# Patient Record
Sex: Female | Born: 1945 | Race: Black or African American | Hispanic: No | Smoking: Never smoker
Health system: Southern US, Community
[De-identification: ages and names within clinical notes are randomized; demographics above are authoritative.]

## PROBLEM LIST (undated history)

## (undated) DIAGNOSIS — R51 Headache: Secondary | ICD-10-CM

## (undated) DIAGNOSIS — C50919 Malignant neoplasm of unspecified site of unspecified female breast: Secondary | ICD-10-CM

## (undated) DIAGNOSIS — E785 Hyperlipidemia, unspecified: Secondary | ICD-10-CM

## (undated) DIAGNOSIS — E876 Hypokalemia: Secondary | ICD-10-CM

## (undated) DIAGNOSIS — G629 Polyneuropathy, unspecified: Secondary | ICD-10-CM

## (undated) DIAGNOSIS — M810 Age-related osteoporosis without current pathological fracture: Secondary | ICD-10-CM

## (undated) DIAGNOSIS — R7309 Other abnormal glucose: Secondary | ICD-10-CM

## (undated) DIAGNOSIS — I10 Essential (primary) hypertension: Secondary | ICD-10-CM

## (undated) DIAGNOSIS — N3281 Overactive bladder: Secondary | ICD-10-CM

## (undated) DIAGNOSIS — D869 Sarcoidosis, unspecified: Secondary | ICD-10-CM

## (undated) DIAGNOSIS — Z8679 Personal history of other diseases of the circulatory system: Secondary | ICD-10-CM

## (undated) DIAGNOSIS — G4700 Insomnia, unspecified: Secondary | ICD-10-CM

## (undated) DIAGNOSIS — M8580 Other specified disorders of bone density and structure, unspecified site: Secondary | ICD-10-CM

## (undated) HISTORY — PX: TUBAL LIGATION: SHX77

## (undated) HISTORY — DX: Sarcoidosis, unspecified: D86.9

## (undated) HISTORY — DX: Other specified disorders of bone density and structure, unspecified site: M85.80

## (undated) HISTORY — DX: Other abnormal glucose: R73.09

## (undated) HISTORY — DX: Headache: R51

## (undated) HISTORY — DX: Age-related osteoporosis without current pathological fracture: M81.0

## (undated) HISTORY — DX: Insomnia, unspecified: G47.00

## (undated) HISTORY — DX: Essential (primary) hypertension: I10

## (undated) HISTORY — DX: Polyneuropathy, unspecified: G62.9

## (undated) HISTORY — DX: Personal history of other diseases of the circulatory system: Z86.79

## (undated) HISTORY — PX: WRIST FRACTURE SURGERY: SHX121

## (undated) HISTORY — DX: Malignant neoplasm of unspecified site of unspecified female breast: C50.919

## (undated) HISTORY — PX: OTHER SURGICAL HISTORY: SHX169

## (undated) HISTORY — PX: BREAST LUMPECTOMY: SHX2

## (undated) HISTORY — DX: Hyperlipidemia, unspecified: E78.5

## (undated) HISTORY — DX: Overactive bladder: N32.81

## (undated) HISTORY — DX: Hypokalemia: E87.6

---

## 1997-12-01 ENCOUNTER — Other Ambulatory Visit: Admission: RE | Admit: 1997-12-01 | Discharge: 1997-12-01 | Payer: Self-pay | Admitting: Gastroenterology

## 1998-08-25 ENCOUNTER — Ambulatory Visit (HOSPITAL_COMMUNITY): Admission: RE | Admit: 1998-08-25 | Discharge: 1998-08-25 | Payer: Self-pay | Admitting: *Deleted

## 1998-12-09 ENCOUNTER — Other Ambulatory Visit: Admission: RE | Admit: 1998-12-09 | Discharge: 1998-12-09 | Payer: Self-pay | Admitting: Radiology

## 1999-01-08 ENCOUNTER — Ambulatory Visit (HOSPITAL_COMMUNITY): Admission: RE | Admit: 1999-01-08 | Discharge: 1999-01-08 | Payer: Self-pay | Admitting: Internal Medicine

## 1999-01-08 ENCOUNTER — Encounter: Payer: Self-pay | Admitting: Internal Medicine

## 1999-07-27 ENCOUNTER — Encounter (INDEPENDENT_AMBULATORY_CARE_PROVIDER_SITE_OTHER): Payer: Self-pay | Admitting: Specialist

## 1999-07-27 ENCOUNTER — Ambulatory Visit (HOSPITAL_BASED_OUTPATIENT_CLINIC_OR_DEPARTMENT_OTHER): Admission: RE | Admit: 1999-07-27 | Discharge: 1999-07-27 | Payer: Self-pay

## 1999-12-28 ENCOUNTER — Encounter: Admission: RE | Admit: 1999-12-28 | Discharge: 2000-03-27 | Payer: Self-pay | Admitting: *Deleted

## 2001-02-09 ENCOUNTER — Other Ambulatory Visit: Admission: RE | Admit: 2001-02-09 | Discharge: 2001-02-09 | Payer: Self-pay | Admitting: Obstetrics and Gynecology

## 2001-08-22 ENCOUNTER — Ambulatory Visit (HOSPITAL_COMMUNITY): Admission: RE | Admit: 2001-08-22 | Discharge: 2001-08-22 | Payer: Self-pay | Admitting: Oncology

## 2002-02-27 ENCOUNTER — Other Ambulatory Visit: Admission: RE | Admit: 2002-02-27 | Discharge: 2002-02-27 | Payer: Self-pay | Admitting: Obstetrics and Gynecology

## 2002-12-24 ENCOUNTER — Ambulatory Visit (HOSPITAL_COMMUNITY): Admission: RE | Admit: 2002-12-24 | Discharge: 2002-12-24 | Payer: Self-pay | Admitting: Oncology

## 2002-12-24 ENCOUNTER — Encounter: Payer: Self-pay | Admitting: Oncology

## 2003-01-08 ENCOUNTER — Encounter: Admission: RE | Admit: 2003-01-08 | Discharge: 2003-04-08 | Payer: Self-pay | Admitting: Oncology

## 2003-03-10 ENCOUNTER — Other Ambulatory Visit: Admission: RE | Admit: 2003-03-10 | Discharge: 2003-03-10 | Payer: Self-pay | Admitting: Obstetrics and Gynecology

## 2003-09-12 ENCOUNTER — Ambulatory Visit (HOSPITAL_COMMUNITY): Admission: RE | Admit: 2003-09-12 | Discharge: 2003-09-12 | Payer: Self-pay | Admitting: Internal Medicine

## 2004-09-16 ENCOUNTER — Ambulatory Visit: Payer: Self-pay | Admitting: Oncology

## 2004-11-26 ENCOUNTER — Ambulatory Visit: Payer: Self-pay | Admitting: Internal Medicine

## 2004-12-02 ENCOUNTER — Ambulatory Visit: Payer: Self-pay | Admitting: Internal Medicine

## 2005-05-12 ENCOUNTER — Ambulatory Visit: Payer: Self-pay | Admitting: Internal Medicine

## 2005-09-09 ENCOUNTER — Ambulatory Visit: Payer: Self-pay | Admitting: Oncology

## 2005-09-12 LAB — CBC WITH DIFFERENTIAL/PLATELET
Basophils Absolute: 0 10*3/uL (ref 0.0–0.1)
HCT: 37.3 % (ref 34.8–46.6)
HGB: 12.2 g/dL (ref 11.6–15.9)
LYMPH%: 33 % (ref 14.0–48.0)
MCH: 25.1 pg — ABNORMAL LOW (ref 26.0–34.0)
MONO#: 0.4 10*3/uL (ref 0.1–0.9)
NEUT%: 58.8 % (ref 39.6–76.8)
Platelets: 266 10*3/uL (ref 145–400)
WBC: 5.4 10*3/uL (ref 3.9–10.0)
lymph#: 1.8 10*3/uL (ref 0.9–3.3)

## 2005-09-12 LAB — COMPREHENSIVE METABOLIC PANEL
Albumin: 4.4 g/dL (ref 3.5–5.2)
BUN: 14 mg/dL (ref 6–23)
Calcium: 9.9 mg/dL (ref 8.4–10.5)
Chloride: 104 mEq/L (ref 96–112)
Creatinine, Ser: 0.9 mg/dL (ref 0.4–1.2)
Glucose, Bld: 86 mg/dL (ref 70–99)
Potassium: 4 mEq/L (ref 3.5–5.3)

## 2005-11-17 ENCOUNTER — Ambulatory Visit: Payer: Self-pay | Admitting: Internal Medicine

## 2006-01-06 ENCOUNTER — Ambulatory Visit: Payer: Self-pay | Admitting: Gastroenterology

## 2006-01-09 ENCOUNTER — Ambulatory Visit: Payer: Self-pay | Admitting: Internal Medicine

## 2006-01-10 ENCOUNTER — Ambulatory Visit: Payer: Self-pay | Admitting: Internal Medicine

## 2006-01-18 ENCOUNTER — Ambulatory Visit (HOSPITAL_COMMUNITY): Admission: RE | Admit: 2006-01-18 | Discharge: 2006-01-18 | Payer: Self-pay | Admitting: Ophthalmology

## 2006-01-19 ENCOUNTER — Encounter (INDEPENDENT_AMBULATORY_CARE_PROVIDER_SITE_OTHER): Payer: Self-pay | Admitting: Specialist

## 2006-01-19 ENCOUNTER — Ambulatory Visit: Payer: Self-pay | Admitting: Gastroenterology

## 2006-01-19 HISTORY — PX: POLYPECTOMY: SHX149

## 2006-01-19 HISTORY — PX: COLONOSCOPY: SHX174

## 2006-01-31 ENCOUNTER — Ambulatory Visit: Payer: Self-pay | Admitting: Internal Medicine

## 2006-02-07 ENCOUNTER — Ambulatory Visit: Payer: Self-pay | Admitting: Internal Medicine

## 2006-03-13 ENCOUNTER — Ambulatory Visit: Payer: Self-pay | Admitting: Internal Medicine

## 2006-08-31 ENCOUNTER — Ambulatory Visit: Payer: Self-pay | Admitting: Oncology

## 2006-09-05 LAB — CBC WITH DIFFERENTIAL/PLATELET
Basophils Absolute: 0 10*3/uL (ref 0.0–0.1)
EOS%: 1.7 % (ref 0.0–7.0)
Eosinophils Absolute: 0.1 10*3/uL (ref 0.0–0.5)
HCT: 37.1 % (ref 34.8–46.6)
HGB: 12.3 g/dL (ref 11.6–15.9)
MONO#: 0.5 10*3/uL (ref 0.1–0.9)
NEUT#: 1.9 10*3/uL (ref 1.5–6.5)
NEUT%: 46 % (ref 39.6–76.8)
RDW: 14.2 % (ref 11.3–14.5)
WBC: 4.1 10*3/uL (ref 3.9–10.0)
lymph#: 1.6 10*3/uL (ref 0.9–3.3)

## 2006-09-05 LAB — COMPREHENSIVE METABOLIC PANEL
AST: 25 U/L (ref 0–37)
Albumin: 4.1 g/dL (ref 3.5–5.2)
BUN: 16 mg/dL (ref 6–23)
CO2: 24 mEq/L (ref 19–32)
Calcium: 9.3 mg/dL (ref 8.4–10.5)
Chloride: 105 mEq/L (ref 96–112)
Glucose, Bld: 82 mg/dL (ref 70–99)
Potassium: 3.8 mEq/L (ref 3.5–5.3)

## 2006-12-09 DIAGNOSIS — Z853 Personal history of malignant neoplasm of breast: Secondary | ICD-10-CM | POA: Insufficient documentation

## 2006-12-09 DIAGNOSIS — Z860100 Personal history of colon polyps, unspecified: Secondary | ICD-10-CM | POA: Insufficient documentation

## 2006-12-22 ENCOUNTER — Encounter: Payer: Self-pay | Admitting: Internal Medicine

## 2006-12-22 LAB — CONVERTED CEMR LAB
Pap Smear: NORMAL
Pap Smear: NORMAL

## 2006-12-28 ENCOUNTER — Ambulatory Visit: Payer: Self-pay | Admitting: Internal Medicine

## 2006-12-28 LAB — CONVERTED CEMR LAB
ALT: 28 units/L (ref 0–35)
AST: 39 units/L — ABNORMAL HIGH (ref 0–37)
Albumin: 3.7 g/dL (ref 3.5–5.2)
Alkaline Phosphatase: 74 units/L (ref 39–117)
BUN: 16 mg/dL (ref 6–23)
Basophils Absolute: 0 10*3/uL (ref 0.0–0.1)
Basophils Relative: 0.7 % (ref 0.0–1.0)
CO2: 27 meq/L (ref 19–32)
Calcium: 9.6 mg/dL (ref 8.4–10.5)
Chloride: 104 meq/L (ref 96–112)
Creatinine, Ser: 0.7 mg/dL (ref 0.4–1.2)
Eosinophils Absolute: 0.1 10*3/uL (ref 0.0–0.6)
Eosinophils Relative: 1.8 % (ref 0.0–5.0)
GFR calc Af Amer: 109 mL/min
GFR calc non Af Amer: 90 mL/min
Glucose, Bld: 103 mg/dL — ABNORMAL HIGH (ref 70–99)
HCT: 36.9 % (ref 36.0–46.0)
Hemoglobin: 12.1 g/dL (ref 12.0–15.0)
Lymphocytes Relative: 43.8 % (ref 12.0–46.0)
MCHC: 32.9 g/dL (ref 30.0–36.0)
MCV: 76.4 fL — ABNORMAL LOW (ref 78.0–100.0)
Monocytes Absolute: 0.4 10*3/uL (ref 0.2–0.7)
Monocytes Relative: 7.9 % (ref 3.0–11.0)
Neutro Abs: 2.4 10*3/uL (ref 1.4–7.7)
Neutrophils Relative %: 45.8 % (ref 43.0–77.0)
Platelets: 262 10*3/uL (ref 150–400)
Potassium: 4 meq/L (ref 3.5–5.1)
RBC: 4.82 M/uL (ref 3.87–5.11)
RDW: 13.9 % (ref 11.5–14.6)
Sodium: 139 meq/L (ref 135–145)
Total Bilirubin: 0.7 mg/dL (ref 0.3–1.2)
Total Protein: 7.2 g/dL (ref 6.0–8.3)
WBC: 5.1 10*3/uL (ref 4.5–10.5)

## 2007-01-19 ENCOUNTER — Ambulatory Visit: Payer: Self-pay | Admitting: Internal Medicine

## 2007-01-19 LAB — CONVERTED CEMR LAB
ALT: 22 units/L (ref 0–35)
AST: 29 units/L (ref 0–37)
Albumin: 3.8 g/dL (ref 3.5–5.2)
Alkaline Phosphatase: 69 units/L (ref 39–117)
BUN: 13 mg/dL (ref 6–23)
Basophils Absolute: 0 10*3/uL (ref 0.0–0.1)
Basophils Relative: 0.5 % (ref 0.0–1.0)
Bilirubin Urine: NEGATIVE
Bilirubin, Direct: 0.1 mg/dL (ref 0.0–0.3)
CO2: 28 meq/L (ref 19–32)
Calcium: 9.7 mg/dL (ref 8.4–10.5)
Chloride: 106 meq/L (ref 96–112)
Cholesterol: 229 mg/dL (ref 0–200)
Creatinine, Ser: 0.8 mg/dL (ref 0.4–1.2)
Direct LDL: 148.7 mg/dL
Eosinophils Absolute: 0.1 10*3/uL (ref 0.0–0.6)
Eosinophils Relative: 1.4 % (ref 0.0–5.0)
GFR calc Af Amer: 93 mL/min
GFR calc non Af Amer: 77 mL/min
Glucose, Bld: 105 mg/dL — ABNORMAL HIGH (ref 70–99)
HCT: 37.5 % (ref 36.0–46.0)
HDL: 61.5 mg/dL (ref >39.0)
Hemoglobin: 12.4 g/dL (ref 12.0–15.0)
Ketones, ur: NEGATIVE mg/dL
Leukocytes, UA: NEGATIVE
Lymphocytes Relative: 38.5 % (ref 12.0–46.0)
MCHC: 33.2 g/dL (ref 30.0–36.0)
MCV: 75.3 fL — ABNORMAL LOW (ref 78.0–100.0)
Monocytes Absolute: 0.4 10*3/uL (ref 0.2–0.7)
Monocytes Relative: 7.2 % (ref 3.0–11.0)
Neutro Abs: 2.6 10*3/uL (ref 1.4–7.7)
Neutrophils Relative %: 52.4 % (ref 43.0–77.0)
Nitrite: NEGATIVE
Platelets: 262 10*3/uL (ref 150–400)
Potassium: 4.3 meq/L (ref 3.5–5.1)
RBC: 4.97 M/uL (ref 3.87–5.11)
RDW: 13.5 % (ref 11.5–14.6)
Sodium: 140 meq/L (ref 135–145)
Specific Gravity, Urine: 1.01 (ref 1.000–1.03)
TSH: 1.17 microintl units/mL (ref 0.35–5.50)
Total Bilirubin: 0.7 mg/dL (ref 0.3–1.2)
Total CHOL/HDL Ratio: 3.7
Total Protein, Urine: NEGATIVE mg/dL
Total Protein: 7.4 g/dL (ref 6.0–8.3)
Triglycerides: 92 mg/dL (ref 0–149)
Urine Glucose: NEGATIVE mg/dL
Urobilinogen, UA: 0.2 (ref 0.0–1.0)
VLDL: 18 mg/dL (ref 0–40)
WBC: 5.1 10*3/uL (ref 4.5–10.5)
pH: 5.5 (ref 5.0–8.0)

## 2007-01-25 ENCOUNTER — Ambulatory Visit: Payer: Self-pay | Admitting: Internal Medicine

## 2007-01-26 ENCOUNTER — Encounter: Payer: Self-pay | Admitting: Internal Medicine

## 2007-01-26 DIAGNOSIS — M810 Age-related osteoporosis without current pathological fracture: Secondary | ICD-10-CM

## 2007-01-26 DIAGNOSIS — M8580 Other specified disorders of bone density and structure, unspecified site: Secondary | ICD-10-CM

## 2007-01-26 DIAGNOSIS — Z8679 Personal history of other diseases of the circulatory system: Secondary | ICD-10-CM | POA: Insufficient documentation

## 2007-01-26 DIAGNOSIS — D869 Sarcoidosis, unspecified: Secondary | ICD-10-CM

## 2007-01-26 HISTORY — DX: Other specified disorders of bone density and structure, unspecified site: M85.80

## 2007-01-26 HISTORY — DX: Age-related osteoporosis without current pathological fracture: M81.0

## 2007-01-26 HISTORY — DX: Sarcoidosis, unspecified: D86.9

## 2007-01-26 HISTORY — DX: Personal history of other diseases of the circulatory system: Z86.79

## 2007-02-02 ENCOUNTER — Encounter: Payer: Self-pay | Admitting: Internal Medicine

## 2007-02-02 DIAGNOSIS — Z9889 Other specified postprocedural states: Secondary | ICD-10-CM

## 2007-03-17 ENCOUNTER — Ambulatory Visit: Payer: Self-pay | Admitting: Internal Medicine

## 2007-08-30 ENCOUNTER — Ambulatory Visit: Payer: Self-pay | Admitting: Oncology

## 2007-09-04 ENCOUNTER — Encounter: Payer: Self-pay | Admitting: Internal Medicine

## 2007-09-04 LAB — CBC WITH DIFFERENTIAL/PLATELET
BASO%: 0.2 % (ref 0.0–2.0)
EOS%: 2.6 % (ref 0.0–7.0)
HCT: 37.3 % (ref 34.8–46.6)
MCH: 24.8 pg — ABNORMAL LOW (ref 26.0–34.0)
MCHC: 33.4 g/dL (ref 32.0–36.0)
MONO#: 0.3 10*3/uL (ref 0.1–0.9)
NEUT%: 47.3 % (ref 39.6–76.8)
RBC: 5.03 10*6/uL (ref 3.70–5.32)
RDW: 15.2 % — ABNORMAL HIGH (ref 11.3–14.5)
WBC: 5.1 10*3/uL (ref 3.9–10.0)
lymph#: 2.2 10*3/uL (ref 0.9–3.3)

## 2007-09-04 LAB — COMPREHENSIVE METABOLIC PANEL
AST: 21 U/L (ref 0–37)
BUN: 14 mg/dL (ref 6–23)
Calcium: 9.4 mg/dL (ref 8.4–10.5)
Chloride: 107 mEq/L (ref 96–112)
Creatinine, Ser: 0.78 mg/dL (ref 0.40–1.20)
Total Bilirubin: 0.3 mg/dL (ref 0.3–1.2)

## 2007-09-04 LAB — MORPHOLOGY: PLT EST: ADEQUATE

## 2007-09-11 ENCOUNTER — Encounter: Payer: Self-pay | Admitting: Internal Medicine

## 2007-09-20 ENCOUNTER — Ambulatory Visit: Payer: Self-pay | Admitting: Internal Medicine

## 2007-09-20 DIAGNOSIS — M545 Low back pain, unspecified: Secondary | ICD-10-CM | POA: Insufficient documentation

## 2008-01-03 ENCOUNTER — Ambulatory Visit: Payer: Self-pay | Admitting: Internal Medicine

## 2008-01-03 DIAGNOSIS — J069 Acute upper respiratory infection, unspecified: Secondary | ICD-10-CM | POA: Insufficient documentation

## 2008-01-30 ENCOUNTER — Telehealth: Payer: Self-pay | Admitting: Internal Medicine

## 2008-03-10 ENCOUNTER — Encounter: Payer: Self-pay | Admitting: Internal Medicine

## 2008-03-14 ENCOUNTER — Ambulatory Visit: Payer: Self-pay | Admitting: Internal Medicine

## 2008-03-14 LAB — CONVERTED CEMR LAB
Alkaline Phosphatase: 68 units/L (ref 39–117)
Basophils Absolute: 0 10*3/uL (ref 0.0–0.1)
Bilirubin Urine: NEGATIVE
Bilirubin, Direct: 0.1 mg/dL (ref 0.0–0.3)
Calcium: 9.1 mg/dL (ref 8.4–10.5)
Crystals: NEGATIVE
Eosinophils Absolute: 0.1 10*3/uL (ref 0.0–0.7)
GFR calc Af Amer: 109 mL/min
GFR calc non Af Amer: 90 mL/min
HCT: 37.1 % (ref 36.0–46.0)
HDL: 69.3 mg/dL (ref >39.0)
Ketones, ur: NEGATIVE mg/dL
MCHC: 32.6 g/dL (ref 30.0–36.0)
MCV: 76.6 fL — ABNORMAL LOW (ref 78.0–100.0)
Monocytes Absolute: 0.4 10*3/uL (ref 0.1–1.0)
Monocytes Relative: 8.7 % (ref 3.0–12.0)
Nitrite: NEGATIVE
Platelets: 237 10*3/uL (ref 150–400)
Potassium: 4.3 meq/L (ref 3.5–5.1)
RDW: 14 % (ref 11.5–14.6)
Sodium: 141 meq/L (ref 135–145)
TSH: 0.61 microintl units/mL (ref 0.35–5.50)
Total CHOL/HDL Ratio: 3.4
Total Protein, Urine: NEGATIVE mg/dL
Triglycerides: 61 mg/dL (ref 0–149)

## 2008-03-20 ENCOUNTER — Ambulatory Visit: Payer: Self-pay | Admitting: Internal Medicine

## 2008-05-26 ENCOUNTER — Ambulatory Visit: Payer: Self-pay | Admitting: Internal Medicine

## 2008-05-26 DIAGNOSIS — I10 Essential (primary) hypertension: Secondary | ICD-10-CM

## 2008-05-26 DIAGNOSIS — G44209 Tension-type headache, unspecified, not intractable: Secondary | ICD-10-CM

## 2008-05-26 HISTORY — DX: Essential (primary) hypertension: I10

## 2008-06-02 ENCOUNTER — Encounter: Payer: Self-pay | Admitting: Internal Medicine

## 2008-06-09 ENCOUNTER — Encounter: Payer: Self-pay | Admitting: Internal Medicine

## 2008-07-04 ENCOUNTER — Ambulatory Visit: Payer: Self-pay | Admitting: Internal Medicine

## 2008-07-04 LAB — CONVERTED CEMR LAB
CO2: 29 meq/L (ref 19–32)
GFR calc Af Amer: 93 mL/min
Glucose, Bld: 103 mg/dL — ABNORMAL HIGH (ref 70–99)
Potassium: 3.7 meq/L (ref 3.5–5.1)
Sodium: 139 meq/L (ref 135–145)

## 2008-07-07 ENCOUNTER — Encounter: Payer: Self-pay | Admitting: Internal Medicine

## 2008-07-21 ENCOUNTER — Telehealth: Payer: Self-pay | Admitting: Internal Medicine

## 2008-08-11 ENCOUNTER — Telehealth (INDEPENDENT_AMBULATORY_CARE_PROVIDER_SITE_OTHER): Payer: Self-pay | Admitting: *Deleted

## 2008-08-14 ENCOUNTER — Ambulatory Visit: Payer: Self-pay | Admitting: Internal Medicine

## 2008-08-15 DIAGNOSIS — G4700 Insomnia, unspecified: Secondary | ICD-10-CM

## 2008-08-15 HISTORY — DX: Insomnia, unspecified: G47.00

## 2008-09-01 ENCOUNTER — Ambulatory Visit: Payer: Self-pay | Admitting: Oncology

## 2008-09-03 ENCOUNTER — Encounter: Payer: Self-pay | Admitting: Internal Medicine

## 2008-09-03 LAB — CBC WITH DIFFERENTIAL/PLATELET
BASO%: 1.1 % (ref 0.0–2.0)
EOS%: 2.7 % (ref 0.0–7.0)
HCT: 37.4 % (ref 34.8–46.6)
MCH: 24.7 pg — ABNORMAL LOW (ref 25.1–34.0)
MCHC: 32.9 g/dL (ref 31.5–36.0)
MONO#: 0.5 10*3/uL (ref 0.1–0.9)
NEUT%: 40.9 % (ref 38.4–76.8)
RDW: 14.9 % — ABNORMAL HIGH (ref 11.2–14.5)
WBC: 5.9 10*3/uL (ref 3.9–10.3)
lymph#: 2.8 10*3/uL (ref 0.9–3.3)

## 2008-09-03 LAB — COMPREHENSIVE METABOLIC PANEL
ALT: 18 U/L (ref 0–35)
AST: 24 U/L (ref 0–37)
Albumin: 4.3 g/dL (ref 3.5–5.2)
CO2: 26 mEq/L (ref 19–32)
Calcium: 9.6 mg/dL (ref 8.4–10.5)
Chloride: 102 mEq/L (ref 96–112)
Creatinine, Ser: 0.81 mg/dL (ref 0.40–1.20)
Potassium: 3.4 mEq/L — ABNORMAL LOW (ref 3.5–5.3)
Sodium: 139 mEq/L (ref 135–145)
Total Protein: 7.5 g/dL (ref 6.0–8.3)

## 2008-09-10 ENCOUNTER — Encounter: Payer: Self-pay | Admitting: Internal Medicine

## 2008-10-06 ENCOUNTER — Telehealth: Payer: Self-pay | Admitting: Internal Medicine

## 2009-01-15 ENCOUNTER — Ambulatory Visit: Payer: Self-pay | Admitting: Internal Medicine

## 2009-03-13 ENCOUNTER — Encounter: Payer: Self-pay | Admitting: Internal Medicine

## 2009-03-16 ENCOUNTER — Ambulatory Visit: Payer: Self-pay | Admitting: Internal Medicine

## 2009-03-16 LAB — CONVERTED CEMR LAB
AST: 28 units/L (ref 0–37)
Albumin: 3.8 g/dL (ref 3.5–5.2)
Alkaline Phosphatase: 70 units/L (ref 39–117)
BUN: 16 mg/dL (ref 6–23)
Basophils Absolute: 0 10*3/uL (ref 0.0–0.1)
Bilirubin, Direct: 0 mg/dL (ref 0.0–0.3)
Chloride: 99 meq/L (ref 96–112)
Cholesterol: 260 mg/dL — ABNORMAL HIGH (ref 0–200)
Direct LDL: 173.5 mg/dL
Eosinophils Absolute: 0.1 10*3/uL (ref 0.0–0.7)
GFR calc non Af Amer: 80.93 mL/min (ref >60)
Glucose, Bld: 117 mg/dL — ABNORMAL HIGH (ref 70–99)
HDL: 60.6 mg/dL (ref >39.00)
Hemoglobin: 13 g/dL (ref 12.0–15.0)
Ketones, ur: NEGATIVE mg/dL
Lymphocytes Relative: 36.4 % (ref 12.0–46.0)
MCHC: 33.1 g/dL (ref 30.0–36.0)
Monocytes Relative: 7.6 % (ref 3.0–12.0)
Neutro Abs: 3.2 10*3/uL (ref 1.4–7.7)
Neutrophils Relative %: 54.4 % (ref 43.0–77.0)
Platelets: 284 10*3/uL (ref 150.0–400.0)
Potassium: 3.4 meq/L — ABNORMAL LOW (ref 3.5–5.1)
RDW: 13.7 % (ref 11.5–14.6)
Sodium: 141 meq/L (ref 135–145)
Specific Gravity, Urine: 1.01 (ref 1.000–1.030)
Total Bilirubin: 0.8 mg/dL (ref 0.3–1.2)
Total CHOL/HDL Ratio: 4
Total Protein, Urine: NEGATIVE mg/dL
Urine Glucose: NEGATIVE mg/dL
Urobilinogen, UA: 0.2 (ref 0.0–1.0)
VLDL: 26.6 mg/dL (ref 0.0–40.0)
pH: 6.5 (ref 5.0–8.0)

## 2009-03-23 ENCOUNTER — Ambulatory Visit: Payer: Self-pay | Admitting: Internal Medicine

## 2009-07-10 ENCOUNTER — Telehealth: Payer: Self-pay | Admitting: Internal Medicine

## 2009-09-02 ENCOUNTER — Ambulatory Visit: Payer: Self-pay | Admitting: Oncology

## 2009-09-02 ENCOUNTER — Encounter: Payer: Self-pay | Admitting: Internal Medicine

## 2009-09-02 LAB — COMPREHENSIVE METABOLIC PANEL
ALT: 16 U/L (ref 0–35)
Albumin: 4.3 g/dL (ref 3.5–5.2)
CO2: 28 mEq/L (ref 19–32)
Calcium: 9.7 mg/dL (ref 8.4–10.5)
Chloride: 99 mEq/L (ref 96–112)
Glucose, Bld: 105 mg/dL — ABNORMAL HIGH (ref 70–99)
Potassium: 3.4 mEq/L — ABNORMAL LOW (ref 3.5–5.3)
Sodium: 141 mEq/L (ref 135–145)
Total Protein: 7.3 g/dL (ref 6.0–8.3)

## 2009-09-02 LAB — CBC WITH DIFFERENTIAL/PLATELET
Eosinophils Absolute: 0 10*3/uL (ref 0.0–0.5)
LYMPH%: 39.1 % (ref 14.0–49.7)
MONO#: 0.4 10*3/uL (ref 0.1–0.9)
NEUT#: 3 10*3/uL (ref 1.5–6.5)
Platelets: 294 10*3/uL (ref 145–400)
RBC: 5.01 10*6/uL (ref 3.70–5.45)
RDW: 15.2 % — ABNORMAL HIGH (ref 11.2–14.5)
WBC: 5.7 10*3/uL (ref 3.9–10.3)
lymph#: 2.2 10*3/uL (ref 0.9–3.3)

## 2009-09-08 ENCOUNTER — Encounter: Payer: Self-pay | Admitting: Internal Medicine

## 2009-09-23 ENCOUNTER — Ambulatory Visit: Payer: Self-pay | Admitting: Internal Medicine

## 2009-09-23 LAB — CONVERTED CEMR LAB
HDL: 65.9 mg/dL (ref >39.00)
Hgb A1c MFr Bld: 6.4 % (ref 4.6–6.5)
Triglycerides: 92 mg/dL (ref 0.0–149.0)

## 2009-09-28 ENCOUNTER — Encounter: Payer: Self-pay | Admitting: Internal Medicine

## 2009-10-16 ENCOUNTER — Ambulatory Visit: Payer: Self-pay | Admitting: Internal Medicine

## 2009-10-18 DIAGNOSIS — E785 Hyperlipidemia, unspecified: Secondary | ICD-10-CM

## 2009-10-18 DIAGNOSIS — R7309 Other abnormal glucose: Secondary | ICD-10-CM

## 2009-10-18 DIAGNOSIS — E1149 Type 2 diabetes mellitus with other diabetic neurological complication: Secondary | ICD-10-CM

## 2009-10-18 HISTORY — DX: Hyperlipidemia, unspecified: E78.5

## 2009-10-18 HISTORY — DX: Other abnormal glucose: R73.09

## 2009-11-30 ENCOUNTER — Ambulatory Visit: Payer: Self-pay | Admitting: Internal Medicine

## 2009-11-30 LAB — CONVERTED CEMR LAB
ALT: 19 units/L (ref 0–35)
Direct LDL: 138.6 mg/dL
HDL: 81.9 mg/dL (ref >39.00)
Total Bilirubin: 0.5 mg/dL (ref 0.3–1.2)
Total Protein: 7.6 g/dL (ref 6.0–8.3)
Triglycerides: 150 mg/dL — ABNORMAL HIGH (ref 0.0–149.0)
VLDL: 30 mg/dL (ref 0.0–40.0)

## 2009-12-15 ENCOUNTER — Telehealth: Payer: Self-pay | Admitting: Internal Medicine

## 2009-12-15 ENCOUNTER — Encounter: Payer: Self-pay | Admitting: Internal Medicine

## 2010-03-16 ENCOUNTER — Encounter: Payer: Self-pay | Admitting: Internal Medicine

## 2010-03-17 ENCOUNTER — Telehealth: Payer: Self-pay | Admitting: Internal Medicine

## 2010-04-22 ENCOUNTER — Telehealth: Payer: Self-pay | Admitting: Internal Medicine

## 2010-04-29 ENCOUNTER — Ambulatory Visit: Payer: Self-pay | Admitting: Internal Medicine

## 2010-04-29 LAB — CONVERTED CEMR LAB
BUN: 23 mg/dL (ref 6–23)
Bilirubin Urine: NEGATIVE
Bilirubin, Direct: 0.1 mg/dL (ref 0.0–0.3)
CO2: 30 meq/L (ref 19–32)
Chloride: 97 meq/L (ref 96–112)
Cholesterol: 216 mg/dL — ABNORMAL HIGH (ref 0–200)
Creatinine, Ser: 0.9 mg/dL (ref 0.4–1.2)
Direct LDL: 121.3 mg/dL
Eosinophils Absolute: 0.1 10*3/uL (ref 0.0–0.7)
Ketones, ur: NEGATIVE mg/dL
MCHC: 31.1 g/dL (ref 30.0–36.0)
MCV: 77.6 fL — ABNORMAL LOW (ref 78.0–100.0)
Monocytes Absolute: 0.4 10*3/uL (ref 0.1–1.0)
Neutrophils Relative %: 56.9 % (ref 43.0–77.0)
Platelets: 307 10*3/uL (ref 150.0–400.0)
RDW: 15.8 % — ABNORMAL HIGH (ref 11.5–14.6)
Total Bilirubin: 0.7 mg/dL (ref 0.3–1.2)
Total CHOL/HDL Ratio: 3
Total Protein: 7.3 g/dL (ref 6.0–8.3)
Triglycerides: 111 mg/dL (ref 0.0–149.0)
VLDL: 22.2 mg/dL (ref 0.0–40.0)
pH: 5.5 (ref 5.0–8.0)

## 2010-05-06 ENCOUNTER — Other Ambulatory Visit
Admission: RE | Admit: 2010-05-06 | Discharge: 2010-05-06 | Payer: Self-pay | Source: Home / Self Care | Admitting: Internal Medicine

## 2010-05-06 ENCOUNTER — Encounter: Payer: Self-pay | Admitting: Internal Medicine

## 2010-05-06 ENCOUNTER — Ambulatory Visit: Payer: Self-pay | Admitting: Internal Medicine

## 2010-05-12 ENCOUNTER — Encounter: Payer: Self-pay | Admitting: Internal Medicine

## 2010-06-22 NOTE — Letter (Signed)
[facility] [address] [place], [place]  [address] Phone: [phone]      Sep 28, 2009   [facility] [place] [address] [place], [place] [address]  RE:  LAB RESULTS  Dear  Ms. [name] [name],  The following is an interpretation of your most recent lab tests.  Please take note of any instructions provided or changes to medications that have resulted from your lab work.  Health professionals look at cholesterol as more involved than just the total cholesterol. We consider the level of LDL (bad) cholesterol, HDL (good), cholesterol, and Triglycerides (Grease) in the blood.  1. Your LDL should be under 100, and the HDL should be over 45, if you have any vascular disease such as heart attack, angina, stroke, TIA (mini stroke), claudication (pain in the legs when you walk due to poor circulation),  Abdominal Aortic Aneurysm (AAA), diabetes or prediabetes.  2. Your LDL should be under 130 if you have any two of the following:     a. Smoke or chew tobacco,     b. High blood pressure (if you are on medication or over 140/90 without medication),     c. Female gender,    d. HDL below 40,    e. A female relative (father, brother, or son), who have had any vascular event          as described in #1. above under the age of 18, or a female relative (mother,       sister, or daughter) who had an event as described above under age 44. (An HDL over 60 will subtract one risk factor from the total, so if you have two items in # 2 above, but an HDL over 60, you then fall into category # 3 below).  3. Your LDL should be under 160 if you have any one of the above.  Triglycerides should be under 200 with the ideal being under 150.  For diabetes or pre-diabetes, the ideal HgbA1C should be under 6.0%.  If you fall into any of the above categories, you should make a follow up appointment to discuss this with your physician.  LIPID PANEL:  Abnormal - schedule a follow-up appointment Triglyceride:  92.0   Cholesterol: 252   LDL: DEL   HDL: 65.90   Chol/HDL%:  4   DIABETIC STUDIES:  Good - no changes needed Blood Glucose: 117   HgbA1C: 6.4      LDL cholesterol is definitely not at goal of 100 or less at 152.2. Blood sugar is well controlled.  Please come see me about managing your cholesterol better.    Sincerely Yours,    Jacques Navy MD  Patient: [name] [name] Note: All result statuses are Final unless otherwise noted.  Tests: (1) Lipid Panel (LIPID)   Cholesterol          [H]  252 mg/dL                   2-956     ATP III Classification            Desirable:  < 200 mg/dL                    Borderline High:  200 - 239 mg/dL               High:  > = 240 mg/dL   Triglycerides  92.0 mg/dL                  1.6-109.6     Normal:  <150 mg/dL     Borderline High:  045 - 199 mg/dL   HDL                       40.98 mg/dL                 >11.91   VLDL Cholesterol          18.4 mg/dL                  4.7-82.9  CHO/HDL Ratio:  CHD Risk                             4                    Men          Women     1/2 Average Risk     3.4          3.3     Average Risk          5.0          4.4     2X Average Risk          9.6          7.1     3X Average Risk          15.0          11.0                           Tests: (2) Hemoglobin A1C (A1C)   Hemoglobin A1C            6.4 %                       4.6-6.5     Glycemic Control Guidelines for People with Diabetes:     Non Diabetic:  <6%     Goal of Therapy: <7%     Additional Action Suggested:  >8%   Tests: (3) Cholesterol LDL - Direct (DIRLDL)  Cholesterol LDL - Direct                             152.2 mg/dL     Optimal:  <562 mg/dL     Near or Above Optimal:  100-129 mg/dL     Borderline High:  130-865 mg/dL     High:  784-696 mg/dL     Very High:  >295 mg/dL

## 2010-06-22 NOTE — Progress Notes (Signed)
    Preventive Care Screening  Mammogram:    Date:  03/16/2010    Results:  normal bilateral

## 2010-06-22 NOTE — Letter (Signed)
[facility] [address] [place], [place]  [address] Phone: [phone]      December 15, 2009   [facility] [address] [place], [place] [address]  RE:  LAB RESULTS  Dear  [name] [name],  The following is an interpretation of your most recent lab tests.  Please take note of any instructions provided or changes to medications that have resulted from your lab work.  LIVER FUNCTION TESTS:  Good - no changes needed  Health professionals look at cholesterol as more involved than just the total cholesterol. We consider the level of LDL (bad) cholesterol, HDL (good), cholesterol, and Triglycerides (Grease) in the blood.  1. Your LDL should be under 100, and the HDL should be over 45, if you have any vascular disease such as heart attack, angina, stroke, TIA (mini stroke), claudication (pain in the legs when you walk due to poor circulation),  Abdominal Aortic Aneurysm (AAA), diabetes or prediabetes.  2. Your LDL should be under 130 if you have any two of the following:     a. Smoke or chew tobacco,     b. High blood pressure (if you are on medication or over 140/90 without medication),     c. Female gender,    d. HDL below 40,    e. A female relative (father, brother, or son), who have had any vascular event          as described in #1. above under the age of 84, or a female relative (mother,       sister, or daughter) who had an event as described above under age 71. (An HDL over 60 will subtract one risk factor from the total, so if you have two items in # 2 above, but an HDL over 60, you then fall into category # 3 below).  3. Your LDL should be under 160 if you have any one of the above.  Triglycerides should be under 200 with the ideal being under 150.  For diabetes or pre-diabetes, the ideal HgbA1C should be under 6.0%.  If you fall into any of the above categories, you should make a follow up appointment to discuss this with your physician.  LIPID PANEL:  Fair  - review at your next visit Triglyceride: 150.0   Cholesterol: 250   LDL: DEL   HDL: 81.90   Chol/HDL%:  3     LDL cholesterol not at goal but HDL is fantastic. Plan is to increase lovastatin to 40mg  or change to Crestor 10mg  daily.  Please come see me if you have any questions about these lab results.   Sincerely Yours,    Jacques Navy MD  Patient: [name] [name] Note: All result statuses are Final unless otherwise noted.  Tests: (1) Lipid Panel (LIPID)   Cholesterol          [H]  250 mg/dL                   3-086     ATP III Classification            Desirable:  < 200 mg/dL                    Borderline High:  200 - 239 mg/dL               High:  > = 240 mg/dL   Triglycerides        [H]  150.0 mg/dL  0.0-149.0     Normal:  <150 mg/dL     Borderline High:  161 - 199 mg/dL   HDL                       09.60 mg/dL                 >45.40   VLDL Cholesterol          30.0 mg/dL                  9.8-11.9  CHO/HDL Ratio:  CHD Risk                             3                    Men          Women     1/2 Average Risk     3.4          3.3     Average Risk          5.0          4.4     2X Average Risk          9.6          7.1     3X Average Risk          15.0          11.0                           Tests: (2) Hepatic/Liver Function Panel (HEPATIC)   Total Bilirubin           0.5 mg/dL                   1.4-7.8   Direct Bilirubin          0.1 mg/dL                   2.9-5.6   Alkaline Phosphatase      73 U/L                      39-117   AST                       31 U/L                      0-37   ALT                       19 U/L                      0-35   Total Protein             7.6 g/dL                    2.1-3.0   Albumin                   3.9 g/dL                    8.6-5.7  Tests: (3) Cholesterol LDL - Direct (DIRLDL)  Cholesterol LDL - Direct  138.6 mg/dL

## 2010-06-22 NOTE — Assessment & Plan Note (Signed)
Summary: f/u per pt/#/cd   Vital Signs:  Patient profile:   65 year old female Height:      67 inches Weight:      141.38 pounds BMI:     22.22 O2 Sat:      97 % on Room air Temp:     98.4 degrees F oral Pulse rate:   70 / minute Pulse rhythm:   regular BP sitting:   112 / 64  (left arm) Cuff size:   large  Vitals Entered By: Rock Nephew CMA (Oct 16, 2009 10:52 AM)  O2 Flow:  Room air  Primary Care Provider:  [facility]   History of Present Illness: Patient had CPX in November: LDL was elevated at 175 and serum glucose was elevated. She has been working on life-style management for 6 months. Her recent lab results reveal a A1C of 6.4% and LDL 153. She presents to discuss treatment options. She has been feeling well.   Allergies: 1)  ! Diovan  Past History:  Past Medical History: Last updated: 02/02/2007 OSTEOPOROSIS NOS (ICD-733.00) SARCOIDOSIS (ICD-135) MITRAL VALVE PROLAPSE, HX OF (ICD-V12.50) COLONIC POLYPS, HX OF (ICD-V12.72) BREAST CANCER, HX OF (ICD-V10.3)    Past Surgical History: Last updated: 02/02/2007 LUMPECTOMY, BREAST, HX OF (ICD-V15.2) HEMORRHOIDECTOMY, HX OF (ICD-V45.89)  Family History: Last updated: 02/04/07 father-stomach cancer, died at 57 mother-breast cancer, died at 68 sister with myoma  Social History: Last updated: 05/26/2008 single  two grown children retired [employer] operational officer  Risk Factors: Exercise: yes (05/26/2008)  Risk Factors: Smoking Status: never (2007-02-04)  Review of Systems  The patient denies anorexia, fever, weight loss, weight gain, chest pain, dyspnea on exertion, headaches, abdominal pain, muscle weakness, suspicious skin lesions, and enlarged lymph nodes.    Physical Exam  General:  Well-developed,well-nourished,in no acute distress; alert,appropriate and cooperative throughout examination Lungs:  normal respiratory effort.   Heart:  normal rate and regular rhythm.   Neurologic:  alert & oriented X3, cranial  nerves II-XII intact, and gait normal.   Skin:  turgor normal and color normal.   Psych:  Oriented X3, normally interactive, and good eye contact.     Impression & Recommendations:  Problem # 1:  HYPERGLYCEMIA (ICD-790.29) A1C is over the old standard of 6% but @ 6.4% is below current standard of 6.5%. Discussed with her the meaning of being prediabetic/diabetic and the ramifications.  Plan - no medication with A1C below Goal of 7%.          continue life-style management-diet and exercise  Problem # 2:  HYPERLIPIDEMIA (ICD-272.4)  Discussed the increased cardiac risk for prediabetics in regard to atherosclerosis.  Plan - start lovastatin 20mg  once daily           repeat lab in 4 weeks.  Her updated medication list for this problem includes:    Lovastatin 20 Mg Tabs (Lovastatin) .Marland Kitchen... 1 pos qpm  Orders: Prescription Created Electronically [phone])  Complete Medication List: 1)  Calcium-vitamin D 600-125 Mg-unit Tabs (Calcium-vitamin d) .... Two times a day 2)  Biotin Forte 5 Mg Tabs (Biotin) .... Once daily 3)  Enablex 7.5 Mg Tb24 (Darifenacin hydrobromide) .... Once daily as needed 4)  Boniva 150 Mg Tabs (Ibandronate sodium) .... Take 1 tablet by mouth once a month 5)  Tretinoin 0.05 % Crea (Tretinoin) .... Apply q hs 6)  Triple Flex 500-400-125 Mg Tabs (Glucosamine-chondroitin-msm) .... Take 2 tablets once a day 7)  Chlorthalidone 25 Mg Tabs (Chlorthalidone) .... Take 1 tablet by mouth once  a day 8)  Trazodone Hcl 50 Mg Tabs (Trazodone hcl) .Marland Kitchen.. 1 by mouth at bedtime for sleep 9)  Lovastatin 20 Mg Tabs (Lovastatin) .Marland Kitchen.. 1 pos qpm Prescriptions: LOVASTATIN 20 MG TABS (LOVASTATIN) 1 pos qPM  #30 x 12   Entered and Authorized by:   Jacques Navy MD   Signed by:   Jacques Navy MD on 10/16/2009   Method used:   Electronically to        [facility] [address] (retail)       [phone]       [place], [place]  [address]       Ph:  [phone]       Fax: [phone]   RxID:   [id number]

## 2010-06-22 NOTE — Letter (Signed)
[facility]   Imported By: Sherian Rein 10/14/2009 11:38:16  _____________________________________________________________________  External Attachment:    Type:   Image     Comment:   External Document

## 2010-06-22 NOTE — Progress Notes (Signed)
  Phone Note Refill Request Message from:  Fax from Pharmacy on July 10, 2009 2:21 PM  Refills Requested: Medication #1:  TRAZODONE HCL 50 MG TABS 1 by mouth at bedtime for sleep.   Last Refilled: 01/05/2009 Please Advise refill.  Initial call taken by: Ami Bullins CMA,  July 10, 2009 2:21 PM  Follow-up for Phone Call        ok for refill x 5 Follow-up by: Jacques Navy MD,  July 10, 2009 6:44 PM    Prescriptions: TRAZODONE HCL 50 MG TABS (TRAZODONE HCL) 1 by mouth at bedtime for sleep  #30 x 5   Entered and Authorized by:   Jacques Navy MD   Signed by:   Jacques Navy MD on 07/10/2009   Method used:   Electronically to        [facility] [address] (retail)       [phone]       [place], [place]  [address]       Ph: [phone]       Fax: [phone]   RxID:   [id number]

## 2010-06-22 NOTE — Progress Notes (Signed)
Summary: RESULTS  Phone Note Call from Patient Call back at [facility] Phone [phone]   Summary of Call: Patient is requesting results of labs. Initial call taken by: Lamar Sprinkles, CMA,  December 15, 2009 9:06 AM  Follow-up for Phone Call        LDL 138, better but not at goal. Needs to increase Lovastatin to 40mg  daily or switch to brand name crestor 10mg  daily. Letter to go out with full lab report.  Follow-up by: Jacques Navy MD,  December 15, 2009 9:35 AM  Additional Follow-up for Phone Call Additional follow up Details #1::        Informed pt of results and MD's advisement to increase Lovastatin to 40mg  daily. Additional Follow-up by: Brenton Grills MA,  December 16, 2009 8:34 AM

## 2010-06-22 NOTE — Progress Notes (Signed)
Summary: RF  Phone Note Refill Request Message from:  Pharmacy  Refills Requested: Medication #1:  TRAZODONE HCL 50 MG TABS 1 by mouth at bedtime for sleep Initial call taken by: Lamar Sprinkles, CMA,  April 22, 2010 4:29 PM  Follow-up for Phone Call        ok for refill x 5 Follow-up by: Jacques Navy MD,  April 23, 2010 5:16 PM    Prescriptions: TRAZODONE HCL 50 MG TABS (TRAZODONE HCL) 1 by mouth at bedtime for sleep  #30 x 5   Entered by:   Lamar Sprinkles, CMA   Authorized by:   Jacques Navy MD   Signed by:   Lamar Sprinkles, CMA on 04/23/2010   Method used:   Electronically to        [facility] [address] (retail)       [phone]       [place], [place]  [address]       Ph: [phone]       Fax: [phone]   RxID:   [id number]

## 2010-06-24 NOTE — Assessment & Plan Note (Signed)
Summary: CPX/uhc - secure horizons/cd   Vital Signs:  Patient profile:   65 year old female Height:      67 inches Weight:      143 pounds BMI:     22.48 O2 Sat:      98 % on Room air Temp:     99.0 degrees F oral Pulse rate:   67 / minute BP sitting:   120 / 68  (left arm) Cuff size:   large  Vitals Entered By: Bill Salinas CMA (May 06, 2010 2:35 PM)  O2 Flow:  Room air CC: yearly/ ab   Primary Care Provider:  Norins  CC:  yearly/ ab.  History of Present Illness: for routine evaluation.   C/o GI problems of gas, bloating, flatus, change in digestion. She has no severe pain, hematochezia, hemetemesis. Otherwise she is feeling well.  Ms. Lichtenberger is 100% independent in her ADLs. She has no signs or symptoms of depression. She has not had any falls and has no fall risk.She is cognitively intact-working, managing her family affairs. For gyn care she is up to date.   Preventive Screening-Counseling & Management  Alcohol-Tobacco     Alcohol drinks/day: 0     Smoking Status: never  Caffeine-Diet-Exercise     Caffeine use/day: 2 cups per day     Diet Comments: healthy     Diet Counseling: to improve diet; diet is suboptimal     Does Patient Exercise: yes     Type of exercise: cardio     Exercise (avg: min/session): <30     Times/week: <3  Hep-HIV-STD-Contraception     Hepatitis Risk: no risk noted     HIV Risk: no risk noted     STD Risk: no risk noted     Dental Visit-last 6 months yes     SBE monthly: no     Sun Exposure-Excessive: no  Safety-Violence-Falls     Seat Belt Use: yes     Helmet Use: n/a     Firearms in the Home: no firearms in the home     Smoke Detectors: yes     Violence in the Home: no risk noted     Sexual Abuse: no     Fall Risk: low fall risk      Sexual History:  currently monogamous.        Drug Use:  never.        Blood Transfusions:  no.    Current Medications (verified): 1)  Calcium-Vitamin D 600-125 Mg-Unit  Tabs  (Calcium-Vitamin D) .... Two Times A Day 2)  Biotin Forte 5 Mg  Tabs (Biotin) .... Once Daily 3)  Enablex 7.5 Mg  Tb24 (Darifenacin Hydrobromide) .... Once Daily As Needed 4)  Boniva 150 Mg  Tabs (Ibandronate Sodium) .... Take 1 Tablet By Mouth Once A Month 5)  Tretinoin 0.05 %  Crea (Tretinoin) .... Apply Q Hs 6)  Triple Flex 500-400-125 Mg Tabs (Glucosamine-Chondroitin-Msm) .... Take 2 Tablets Once A Day 7)  Chlorthalidone 25 Mg Tabs (Chlorthalidone) .... Take 1 Tablet By Mouth Once A Day 8)  Trazodone Hcl 50 Mg Tabs (Trazodone Hcl) .Marland Kitchen.. 1 By Mouth At Bedtime For Sleep 9)  Lovastatin 20 Mg Tabs (Lovastatin) .Marland Kitchen.. 1 Pos Qpm  Allergies (verified): 1)  ! Diovan  Past History:  Past Medical History: Last updated: 02/02/2007 OSTEOPOROSIS NOS (ICD-733.00) SARCOIDOSIS (ICD-135) MITRAL VALVE PROLAPSE, HX OF (ICD-V12.50) COLONIC POLYPS, HX OF (ICD-V12.72) BREAST CANCER, HX OF (ICD-V10.3)  Past Surgical History: Last updated: 02/02/2007 LUMPECTOMY, BREAST, HX OF (ICD-V15.2) HEMORRHOIDECTOMY, HX OF (ICD-V45.89)  Family History: Last updated: 2007/02/15 father-stomach cancer, died at 2 mother-breast cancer, died at 93 sister with myoma  Social History: Last updated: 05/26/2008 single  two grown children retired [employer] operational officer  Social History: Caffeine use/day:  2 cups per day Dental Care w/in 6 mos.:  yes Sun Exposure-Excessive:  no Risk analyst Use:  yes Fall Risk:  low fall risk Hepatitis Risk:  no risk noted HIV Risk:  no risk noted STD Risk:  no risk noted Sexual History:  currently monogamous Drug Use:  never Blood Transfusions:  no  Review of Systems       The patient complains of abdominal pain.  The patient denies anorexia, fever, weight loss, weight gain, vision loss, decreased hearing, chest pain, syncope, dyspnea on exertion, prolonged cough, melena, hematochezia, severe indigestion/heartburn, muscle weakness, suspicious skin lesions, difficulty walking, depression,  unusual weight change, abnormal bleeding, angioedema, and breast masses.    Physical Exam  General:  Well-developed,well-nourished,in no acute distress; alert,appropriate and cooperative throughout examination Head:  Normocephalic and atraumatic without obvious abnormalities. No apparent alopecia or balding. Eyes:  No corneal or conjunctival inflammation noted. EOMI. Perrla. Funduscopic exam benign, without hemorrhages, exudates or papilledema. Vision grossly normal. Ears:  External ear exam shows no significant lesions or deformities.  Otoscopic examination reveals clear canals, tympanic membranes are intact bilaterally without bulging, retraction, inflammation or discharge. Hearing is grossly normal bilaterally. Nose:  no external deformity and no external erythema.   Mouth:  Oral mucosa and oropharynx without lesions or exudates.  Teeth in good repair. Neck:  supple, full ROM, no thyromegaly, and no carotid bruits.   Chest Wall:  No deformities, masses, or tenderness noted. Breasts:  No mass, nodules, thickening, tenderness, bulging, retraction, inflamation, nipple discharge or skin changes noted.  imperceptible lumpectomy scar Lungs:  Normal respiratory effort, chest expands symmetrically. Lungs are clear to auscultation, no crackles or wheezes. Heart:  Normal rate and regular rhythm. S1 and S2 normal without gallop, murmur, click, rub or other extra sounds. Abdomen:  soft, non-tender, normal bowel sounds, no masses, no guarding, and no hepatomegaly.   Genitalia:  deferred Msk:  normal ROM, no joint tenderness, no joint swelling, no joint warmth, and no joint deformities.   Pulses:  2+ radial and DP pulses Extremities:  No clubbing, cyanosis, edema, or deformity noted with normal full range of motion of all joints.   Neurologic:  alert & oriented X3, cranial nerves II-XII intact, strength normal in all extremities, gait normal, and DTRs symmetrical and normal.   Skin:  turgor normal, color  normal, no rashes, and no suspicious lesions.   Cervical Nodes:  no anterior cervical adenopathy and no posterior cervical adenopathy.   Axillary Nodes:  no R axillary adenopathy and no L axillary adenopathy.   Inguinal Nodes:  no R inguinal adenopathy and no L inguinal adenopathy.   Psych:  Oriented X3, memory intact for recent and remote, normally interactive, and good eye contact.     Impression & Recommendations:  Problem # 1:  HYPERLIPIDEMIA (ICD-272.4) Taking medication without adverse side affects. LDL 121 at or below goal of 130 or less.  Plan- continue present medications.  Her updated medication list for this problem includes:    Lovastatin 20 Mg Tabs (Lovastatin) .Marland Kitchen... 1 pos qpm  Problem # 2:  HYPERGLYCEMIA (ICD-790.29) serum glucose is 121, above goal of 70-115. Discussed the need for a low or no  sugar diet and low carbohydrate diet.  Plan - repeat serum glucose and A1C in 6 months.  Problem # 3:  INSOMNIA, CHRONIC (ICD-307.42) no complaint of continued problem at today's visit.  Problem # 4:  HYPERTENSION (ICD-401.9)  Her updated medication list for this problem includes:    Chlorthalidone 25 Mg Tabs (Chlorthalidone) .Marland Kitchen... Take 1 tablet by mouth once a day  BP today: 120/68 Prior BP: 112/64 (10/16/2009)  Prior 10 Yr Risk Heart Disease: Not enough information (03/23/2009)  Labs Reviewed: K+: 3.3 (04/29/2010) Creat: : 0.9 (04/29/2010)   Excellent control on low dose diuretic. Will continue the same.  Problem # 5:  OSTEOPOROSIS NOS (ICD-733.00) No bone density study in EMR. Will request that next bone density study be forwarded to me or her record.  Her updated medication list for this problem includes:    Boniva 150 Mg Tabs (Ibandronate sodium) .Marland Kitchen... Take 1 tablet by mouth once a month  Problem # 6:  SARCOIDOSIS (ICD-135) no active symptoms at this time.  Problem # 7:  BREAST CANCER, HX OF (ICD-V10.3) Doing well. Has had recent mammogram that was  negative.  Problem # 8:  Preventive Health Care (ICD-V70.0) Benign interval history. Physical exam, limited, was normal. Lab results are within normal limits except for serum glucose. Current with colorectal cancer screening with last colonoscopy Aug '07. Current with mammography Oct '11. Immunizations: flu and pnemovax given today. She should consider shingles vaccine and check her insurance coverage for this. 12 Lead EKG normal with no signs of ischemia.  In summary - a delightful woman who is medically stable and doing well. She will return for lab in six months as noted otherwise she will return in 1 year or as needed.   Complete Medication List: 1)  Calcium-vitamin D 600-125 Mg-unit Tabs (Calcium-vitamin d) .... Two times a day 2)  Biotin Forte 5 Mg Tabs (Biotin) .... Once daily 3)  Enablex 7.5 Mg Tb24 (Darifenacin hydrobromide) .... Once daily as needed 4)  Boniva 150 Mg Tabs (Ibandronate sodium) .... Take 1 tablet by mouth once a month 5)  Tretinoin 0.05 % Crea (Tretinoin) .... Apply q hs 6)  Triple Flex 500-400-125 Mg Tabs (Glucosamine-chondroitin-msm) .... Take 2 tablets once a day 7)  Chlorthalidone 25 Mg Tabs (Chlorthalidone) .... Take 1 tablet by mouth once a day 8)  Trazodone Hcl 50 Mg Tabs (Trazodone hcl) .Marland Kitchen.. 1 by mouth at bedtime for sleep 9)  Lovastatin 20 Mg Tabs (Lovastatin) .Marland Kitchen.. 1 pos qpm  Other Orders: Pneumococcal Vaccine (56433) Admin 1st Vaccine (29518) Flu Vaccine 14yrs + (84166) Admin of Any Addtl Vaccine (06301) Welcome to Medicare, Physical (S0109)   Patient: [name] [name] Note: All result statuses are Final unless otherwise noted.  Tests: (1) BMP (METABOL)   Sodium                    138 mEq/L                   135-145   Potassium            [L]  3.3 mEq/L                   3.5-5.1   Chloride                  97 mEq/L                    96-112   Carbon Dioxide  30 mEq/L                    19-32   Glucose              [H]  121 mg/dL                    16-10   BUN                       23 mg/dL                    9-60   Creatinine                0.9 mg/dL                   4.5-4.0   Calcium                   9.7 mg/dL                   9.8-11.9   GFR                       77.65 mL/min                >60.00  Tests: (2) CBC Platelet w/Diff (CBCD)   White Cell Count          7.0 K/uL                    4.5-10.5   Red Cell Count       [H]  5.49 Mil/uL                 3.87-5.11   Hemoglobin                13.2 g/dL                   14.7-82.9   Hematocrit                42.6 %                      36.0-46.0   MCV                  [L]  77.6 fl                     78.0-100.0   MCHC                      31.1 g/dL                   56.2-13.0   RDW                  [H]  15.8 %                      11.5-14.6   Platelet Count            307.0 K/uL                  150.0-400.0   Neutrophil %              56.9 %  43.0-77.0   Lymphocyte %              35.5 %                      12.0-46.0   Monocyte %                6.2 %                       3.0-12.0   Eosinophils%              0.9 %                       0.0-5.0   Basophils %               0.5 %                       0.0-3.0   Neutrophill Absolute      4.0 K/uL                    1.4-7.7   Lymphocyte Absolute       2.5 K/uL                    0.7-4.0   Monocyte Absolute         0.4 K/uL                    0.1-1.0  Eosinophils, Absolute                             0.1 K/uL                    0.0-0.7   Basophils Absolute        0.0 K/uL                    0.0-0.1  Tests: (3) Hepatic/Liver Function Panel (HEPATIC)   Total Bilirubin           0.7 mg/dL                   1.6-1.0   Direct Bilirubin          0.1 mg/dL                   9.6-0.4   Alkaline Phosphatase      86 U/L                      39-117   AST                       27 U/L                      0-37   ALT                       22 U/L                      0-35   Total Protein             7.3 g/dL                     5.4-0.9  Albumin                   3.9 g/dL                    8.2-9.5  Tests: (4) TSH (TSH)   FastTSH                   0.66 uIU/mL                 0.35-5.50  Tests: (5) Lipid Panel (LIPID)   Cholesterol          [H]  216 mg/dL                   6-213     ATP III Classification            Desirable:  < 200 mg/dL                    Borderline High:  200 - 239 mg/dL               High:  > = 240 mg/dL   Triglycerides             111.0 mg/dL                 0.8-657.8     Normal:  <150 mg/dL     Borderline High:  469 - 199 mg/dL   HDL                       62.95 mg/dL                 >28.41   VLDL Cholesterol          22.2 mg/dL                  3.2-44.0  CHO/HDL Ratio:  CHD Risk                             3                    Men          Women     1/2 Average Risk     3.4          3.3     Average Risk          5.0          4.4     2X Average Risk          9.6          7.1     3X Average Risk          15.0          11.0                           Tests: (6) UDip w/Micro (URINE)   Color                     LT. YELLOW       RANGE:  Yellow;Lt. Yellow   Clarity                   CLEAR  Clear   Specific Gravity          1.020                       1.000 - 1.030   Urine Ph                  5.5                         5.0-8.0   Protein                   NEGATIVE                    Negative   Urine Glucose             NEGATIVE                    Negative   Ketones                   NEGATIVE                    Negative   Urine Bilirubin           NEGATIVE                    Negative   Blood                     MODERATE                    Negative   Urobilinogen              0.2                         0.0 - 1.0   Leukocyte Esterace        TRACE                       Negative   Nitrite                   NEGATIVE                    Negative   Urine WBC                 0-2/hpf                     0-2/hpf   Urine RBC                 3-6/hpf                     0-2/hpf    Urine Mucus               Presence of                 None   Urine Epith               Rare(0-4/hpf)               Rare(0-4/hpf)  Tests: (7) Cholesterol LDL - Direct (DIRLDL)  Cholesterol LDL - Direct  121.3 mg/dL     Optimal:  <160 mg/dL     Near or Above Optimal:  100-129 mg/dL     Borderline High:  109-323 mg/dL     High:  557-322 mg/dL     Very High:  >025 mg/dL  Orders Added: 1)  Pneumococcal Vaccine [90732] 2)  Admin 1st Vaccine [90471] 3)  Flu Vaccine 27yrs + [42706] 4)  Admin of Any Addtl Vaccine [90472] 5)  Welcome to Medicare, Physical [G0402]   Immunizations Administered:  Pneumonia Vaccine:    Vaccine Type: Pneumovax    Site: right deltoid    Mfr: Merck    Dose: 0.5 ml    Route: IM    Given by: Ami Bullins CMA    Exp. Date: 09/30/2011    Lot #: [id number]    VIS given: 04/27/09 version given May 06, 2010.  Influenza Vaccine # 1:    Vaccine Type: Fluvax 3+    Site: right buttock    Mfr: GlaxoSmithKline    Dose: 0.5 ml    Route: IM    Given by: Ami Bullins CMA    Exp. Date: 11/20/2010    Lot #: [id number]    VIS given: 12/15/09 version given May 06, 2010.  Flu Vaccine Consent Questions:    Do you have a history of severe allergic reactions to this vaccine? no    Any prior history of allergic reactions to egg and/or gelatin? no    Do you have a sensitivity to the preservative Thimersol? no    Do you have a past history of Guillan-Barre Syndrome? no    Do you currently have an acute febrile illness? no    Have you ever had a severe reaction to latex? no    Vaccine information given and explained to patient? yes    Are you currently pregnant? no   Immunizations Administered:  Pneumonia Vaccine:    Vaccine Type: Pneumovax    Site: right deltoid    Mfr: Merck    Dose: 0.5 ml    Route: IM    Given by: Ami Bullins CMA    Exp. Date: 09/30/2011    Lot #: [id number]    VIS given: 04/27/09 version given May 06, 2010.  Influenza Vaccine # 1:    Vaccine Type: Fluvax 3+    Site: right buttock    Mfr: GlaxoSmithKline    Dose: 0.5 ml    Route: IM    Given by: Ami Bullins CMA    Exp. Date: 11/20/2010    Lot #: [id number]    VIS given: 12/15/09 version given May 06, 2010.

## 2010-06-24 NOTE — Letter (Signed)
   [facility] [address] [place], [place]  [address] Phone: [phone]      May 13, 2010   [facility] [address] [place], [place] [address]  RE:  LAB RESULTS  Dear  Ms. Sedalia Muta,  The following is an interpretation of your most recent lab tests.  Please take note of any instructions provided or changes to medications that have resulted from your lab work.  Pap Smear: normal     Merry Christmas   Sincerely Yours,    Jacques Navy MD

## 2010-09-08 ENCOUNTER — Encounter (HOSPITAL_BASED_OUTPATIENT_CLINIC_OR_DEPARTMENT_OTHER): Payer: Medicare Other | Admitting: Oncology

## 2010-09-08 ENCOUNTER — Other Ambulatory Visit: Payer: Self-pay | Admitting: Oncology

## 2010-09-08 DIAGNOSIS — C50319 Malignant neoplasm of lower-inner quadrant of unspecified female breast: Secondary | ICD-10-CM

## 2010-09-08 DIAGNOSIS — Z853 Personal history of malignant neoplasm of breast: Secondary | ICD-10-CM

## 2010-09-08 DIAGNOSIS — M899 Disorder of bone, unspecified: Secondary | ICD-10-CM

## 2010-09-08 DIAGNOSIS — Z171 Estrogen receptor negative status [ER-]: Secondary | ICD-10-CM

## 2010-09-08 LAB — COMPREHENSIVE METABOLIC PANEL
ALT: 16 U/L (ref 0–35)
AST: 25 U/L (ref 0–37)
Alkaline Phosphatase: 82 U/L (ref 39–117)
Sodium: 139 mEq/L (ref 135–145)
Total Bilirubin: 0.4 mg/dL (ref 0.3–1.2)
Total Protein: 7.3 g/dL (ref 6.0–8.3)

## 2010-09-08 LAB — CBC WITH DIFFERENTIAL/PLATELET
BASO%: 0.1 % (ref 0.0–2.0)
LYMPH%: 37.2 % (ref 14.0–49.7)
MCHC: 32.6 g/dL (ref 31.5–36.0)
MCV: 76.2 fL — ABNORMAL LOW (ref 79.5–101.0)
MONO%: 7.8 % (ref 0.0–14.0)
Platelets: 295 10*3/uL (ref 145–400)
RBC: 4.99 10*6/uL (ref 3.70–5.45)
WBC: 5 10*3/uL (ref 3.9–10.3)

## 2010-10-08 NOTE — Assessment & Plan Note (Signed)
[facility]                             PRIMARY CARE OFFICE NOTE   NAME:[name], [name] PALACIOS                          MRN:          [id number]  DATE:02/07/2006                            DOB:          April 02, 1946    [name] [name] is a delightful 65 year old woman who presents for routine  evaluation. She was last in the office January 10, 2006 for low back pain and  urinary frequency diagnosed as an irritable bladder and given a trial of  Enablex. The patient also is having trouble with knee pain and she was to  return to her orthopedist. In the interval, the patient has been seen by Dr.  Tenny Craw in [facility] and had bilateral knee injections with steroids. She has  had good results and is now resuming most of her activities. The patient did  not mention whether or not she had good results with Enablex.   The patient is concerned that Ambien is causing her to have grogginess,  forgetfulness and amnestic type symptoms.   PAST MEDICAL HISTORY:  Well documented in my note on December 02, 2004.   CURRENT MEDICATIONS:  1. Calcium D twice daily.  2. Multivitamin.  3. Fosamax 70 mg weekly.  4. Metronidazole cream 0.75% b.i.d. for rosacea.  5. Biotin 5 mg daily.  6. __________  5 mg q.h.s.   REVIEW OF SYSTEMS:  With the help of Azzie Glatter revealed no  constitutional problems. She had her last eye exam in August 2007. No ENT,  cardiovascular or respiratory problems. GI is notable for a recent  colonoscopy January 19, 2006 positive for diverticulosis and colon polyps.  Final path report is pending. The patient has had no back pain or back  discomfort. She has had no dysuria. She has had no musculoskeletal  complaints. Derm is positive for a yellowing of her great toenail on the  right foot. Question of fungal infection. The patient has had no neurologic  or psychiatric problems. The patient is scheduled for mammography in  December 2007.   PHYSICAL EXAMINATION:  VITAL  SIGNS:  Temperature was 100.1, blood pressure  141/70, pulse 69, weight 157.  GENERAL:  A well-nourished, well-developed, [place] woman in no acute  distress.  HEENT:  Normocephalic, atraumatic. EACs and TMs were normal. Oropharynx with  native dentition in good repair. No buccal or palatal lesions were noted.  The posterior pharynx was clear, conjunctiva and sclera was clear. Pupils  equal, round and reactive to light and accommodation. Funduscopic exam  deferred to ophthalmology.  NECK:  Supple without thyromegaly.  NODES:  No adenopathy was noted in the cervical or supraclavicular regions.  CHEST:  No CVA tenderness.  LUNGS:  Clear to auscultation and percussion.  BREASTS:  Deferred to Dr. Nena Polio.  CARDIOVASCULAR:  2+ radial pulses, no JVD or carotid bruit. She had a quiet  precordium with a regular rate and rhythm without murmurs, rubs or gallops.  ABDOMEN:  Soft, no guarding or rebound. No organosplenomegaly was  appreciated.  PELVIC/RECTAL:  Deferred to GI and gynecology.  EXTREMITIES:  Without clubbing, cyanosis, edema or deformity.  NEUROLOGIC:  Nonfocal.   DATABASE:  Hemoglobin 12.1 g, white count was 6800 with a normal  differential. Chemistries were unremarkable. The serum glucose was 114.  Electrolytes were normal. Liver functions were normal. Creatinine was 0.9  with a GFR of 68 mL per minute. Thyroid function normal with a TSH is 0.99.  Urinalysis with moderate blood and 0-2 RBCs per high powered field.  Cholesterol was 202, triglycerides 83, HDL 65.1, LDL was 116.   ASSESSMENT/PLAN:  1. Oncology. The patient is status post breast cancer. She is followed on      a regular basis by Dr. Darrold Span. She has been stable and doing well.  2. Hypertension. The patient's blood pressure is borderline, controlled at      this time. She is asked to monitor her blood pressure on a regular      basis and if she continues to have systolic's greater than 140 she may      be a  candidate for medical therapy.  3. Osteoarthritis. Patient with significant problem with knee pain and      discomfort. Status post steroid injections and doing well.  4. Mitral valve prolapse. The patient has a history of mitral valve      prolapse in the past. Her last 2-D echo was March 01, 2004 in which      there was no evidence of stenosis, fluttering or prolapse. There was a      trace of mitral regurgitation.  5. Pulmonary. 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 We discussed screening and the fact      that if she is asymptomatic that additional testing at this time is not      necessary.  6. Derm.  Patient with somewhat dark to black streak on the great toenail      of the right foot. This is not a classic presentation for      onychomycosis.  The patient is to notify her dermatologist and have him      evaluate this as to where there is a need for biopsy.   HEALTH MAINTENANCE:  The patient is currently up to date with colorectal  cancer screening. She does see a gynecologist on an annual basis. She does  have mammograms scheduled.   In summary, this very pleasant patient seems medically stable at this time.  She is asked to monitor her blood pressure as noted above. She is asked to  return to see me on a p.r.n. basis or in one year.                                   Rosalyn Gess Norins, MD   MEN/MedQ  DD:  02/07/2006 DT:  02/08/2006 Job #:  [id number]   cc:   Lennis P. Darrold Span, M.D.  Raina Mina, M.D.

## 2010-10-08 NOTE — Op Note (Signed)
[facility]  Patient:    [name] [name], [name] [name]                          MRN: [id number] Proc. Date: 07/27/99 Adm. Date:  [id number] Disc. Date: [id number] Attending:  Gennie Alma CC:         Jeralyn Ruths, M.D.             Pershing Simeone, M.D.                           Operative Report  CCS# [id number]  PREOPERATIVE DIAGNOSIS:  Mass of left breast at the 12 oclock radial.  POSTOPERATIVE DIAGNOSIS:  Mass of breast or left chest wall--6 oclock radial.  OPERATION PERFORMED:  Left breast biopsy.  SURGEON:  Milus Mallick, M.D.  ANESTHESIA:  Local infiltration with 1% Xylocaine--20 cc and monitored anesthesia care.  DESCRIPTION OF PROCEDURE:  Under adequate perioperative intravenous sedation, the patients left breast was prepped and draped in the usual fashion.  Satisfactory  local anesthesia was instilled surrounding a palpable nodule at the 6 oclock radial of the left breast that was approximately 1.5 cm in diameter and tender o palpation.  A radial incision was made at the 6 oclock radial and carried down into the subcutaneous tissue.  Bleeders were electrocoagulated.  Medial and lateral skin flaps were developed.  The nodule in question was palpable and it was totally excised with scalpel and electrocoagulation.  In order to excise the nodule in toto, a portion of the chest wall had to be removed along with it suggesting that this might have its origin in the chest wall instead of in the breast.  A portion of the pectoralis major muscle was the deep surface of the biopsy.  The lesion as palpable within the specimen.  It was sent for routine pathologic study. Hemostasis was ascertained.  One large bleeder was suture ligated with 3-0 chromic catgut.  The remaining bleeding vessels were electrocoagulated.  The subcutaneous layer was reapproximated with continuous suture of 5-0 Vicryl.  Half-inch Steri-Strips were applied to the  skin and sterile dressing was applied. Estimated blood loss for this procedure was approximately 50 cc.  The patient tolerated the procedure well and left the operating room in satisfactory condition.  DD:  07/27/99 TD:  07/28/99 Job: [id number]

## 2010-10-26 ENCOUNTER — Other Ambulatory Visit (INDEPENDENT_AMBULATORY_CARE_PROVIDER_SITE_OTHER): Payer: Medicare Other

## 2010-10-26 ENCOUNTER — Telehealth: Payer: Self-pay | Admitting: *Deleted

## 2010-10-26 DIAGNOSIS — R7309 Other abnormal glucose: Secondary | ICD-10-CM

## 2010-10-26 NOTE — Telephone Encounter (Signed)
Lab order needed, entered from last OV notes December 2012

## 2010-10-27 ENCOUNTER — Encounter: Payer: Self-pay | Admitting: Internal Medicine

## 2010-11-03 ENCOUNTER — Telehealth: Payer: Self-pay | Admitting: *Deleted

## 2010-11-03 NOTE — Telephone Encounter (Signed)
Patient requesting a call, has questions about lab letter.

## 2010-11-03 NOTE — Telephone Encounter (Signed)
Left mess to call office back.   

## 2010-11-04 NOTE — Telephone Encounter (Signed)
Spoke w/pt - she wanted to know why MD did not re-check cholesterol - only did a1c. Per last ov notes, LDL was at goal but cbg was slightly elevated so only needed a1c. She says she is loosing wt w/o trying. Advised f/u OV w/MD to discuss, she agreed.

## 2010-11-08 ENCOUNTER — Ambulatory Visit (INDEPENDENT_AMBULATORY_CARE_PROVIDER_SITE_OTHER): Payer: Medicare Other | Admitting: Internal Medicine

## 2010-11-08 DIAGNOSIS — E785 Hyperlipidemia, unspecified: Secondary | ICD-10-CM

## 2010-11-08 DIAGNOSIS — I10 Essential (primary) hypertension: Secondary | ICD-10-CM

## 2010-11-08 NOTE — Assessment & Plan Note (Signed)
Good control of BP on present regimen - see vital signs

## 2010-11-08 NOTE — Assessment & Plan Note (Signed)
Good control of cholesterol on present regimen of medication and low fat diet.

## 2010-11-08 NOTE — Progress Notes (Signed)
  Subjective:    Patient ID: [name] [name], female    DOB: August 12, 1945, 65 y.o.   MRN: [id number]  HPI Mrs. Cerone presents with a concern for weight loss. Chart reviewed:  Wt Readings from Last 3 Encounters:  11/08/10 145 lb (65.772 kg)  05/06/10 143 lb (64.864 kg)  10/16/09 141 lb 6.1 oz (64.13 kg)  In November '10 she weight 160 lbs. At that time she went on a low fat diet due to high cholesterol and had the majority of weight loss of the succeeding 6 months. She does report she feels cold but not weak or low on energy. She has been trying to gain a little weight but is limited in her diet due to need for low fat, low sugar and lactose intolerance. She denies night sweats, change in bowel habit or other systemic problems.    Review of Systems Review of Systems  Constitutional:  Negative for fever, chills, activity change and unexpected weight change.  HEENT:  Negative for hearing loss, ear pain, congestion, neck stiffness and postnasal drip. Negative for sore throat or swallowing problems. Negative for dental complaints.   Eyes: Negative for vision loss or change in visual acuity.  Respiratory: Negative for chest tightness and wheezing.   Cardiovascular: Negative for chest pain and palpitationNo decreased exercise tolerance Gastrointestinal: No change in bowel habit. No bloating or gas. No reflux or indigestion Genitourinary: Negative for urgency, frequency, flank pain and difficulty urinating.  Musculoskeletal: Negative for myalgias, back pain, arthralgias and gait problem.  Neurological: Negative for dizziness, tremors, weakness and headaches.  Hematological: Negative for adenopathy.  Psychiatric/Behavioral: Negative for behavioral problems and dysphoric mood.       Objective:   Physical Exam Vitals reviewed - calculate BMI 22.7 - normal Genm'l WNWD AA woman in no distress HEENT C&S clear Cor - RRR  Abd - BS+, no guarding or rebound  Lab Results  Component Value Date   WBC 7.0  04/29/2010   HGB 12.4 09/08/2010   HCT 38.0 09/08/2010   PLT 295 09/08/2010   CHOL 216* 04/29/2010   TRIG 111.0 04/29/2010   HDL 69.30 04/29/2010   LDLDIRECT 121.3 04/29/2010   ALT 16 09/08/2010   AST 25 09/08/2010   NA 139 09/08/2010   K 3.3* 09/08/2010   CL 99 09/08/2010   CREATININE 0.90 09/08/2010   BUN 19 09/08/2010   CO2 27 09/08/2010   TSH 0.66 04/29/2010   HGBA1C 6.4 10/26/2010           Assessment & Plan:  1. Weight loss - there has been no weight loss for a year. The initial weight loss from Nov '10 to May '11 was almost certainly due to a change in diet - reducing fat and sugar intake. Since that time your weight has been stable - at an ideal level: BMI 22.7 (normal 18.5 - 24.9). You have had normal cancer screening with mammogram and colonoscopy. You have had no signs or symptoms to suggest underlying malignancy. There is no lab evidence of anemia or thyroid abnormality. There are no signs of generalized sickness.   Plan - if you feel a need to gain weight you can increase you intake of fat containing foods at the expense of good cholesterol control with a minor increase in risk for atherosclerosis. Please do not liberalize you intake of sugar or carbohydrates.

## 2010-11-17 ENCOUNTER — Other Ambulatory Visit: Payer: Self-pay | Admitting: Internal Medicine

## 2010-11-18 NOTE — Telephone Encounter (Signed)
Please advise refills 

## 2010-11-19 NOTE — Telephone Encounter (Signed)
Ok to refill prn.

## 2010-11-22 ENCOUNTER — Telehealth: Payer: Self-pay | Admitting: *Deleted

## 2010-11-22 MED ORDER — TRAZODONE HCL 50 MG PO TABS: 50.0000 mg | ORAL_TABLET | Freq: Every day | ORAL | Status: DC

## 2010-11-22 NOTE — Telephone Encounter (Signed)
Ok for refills prn  

## 2010-11-22 NOTE — Telephone Encounter (Signed)
Last OV June 2012. Pt requesting refill on Trazodone 50mg  take one tablet by mouth at bedtime for sleep. Qty 30 Last fill date was 10/24/2010, Please Advise refills.

## 2010-12-05 ENCOUNTER — Other Ambulatory Visit: Payer: Self-pay | Admitting: Internal Medicine

## 2011-02-25 ENCOUNTER — Encounter: Payer: Self-pay | Admitting: Gastroenterology

## 2011-03-25 ENCOUNTER — Encounter: Payer: Self-pay | Admitting: Internal Medicine

## 2011-04-06 ENCOUNTER — Telehealth: Payer: Self-pay | Admitting: *Deleted

## 2011-04-06 MED ORDER — TRAZODONE HCL 50 MG PO TABS: 50.0000 mg | ORAL_TABLET | Freq: Every day | ORAL | Status: DC

## 2011-04-06 NOTE — Telephone Encounter (Signed)
Last office visit 11/06/2010 received refill request for Trazodone 50 mg SIG take one tablet by mouth at bedtime QTY 30 Please Advise refill in Dr Debby Bud absence

## 2011-04-06 NOTE — Telephone Encounter (Signed)
yes

## 2011-07-13 ENCOUNTER — Other Ambulatory Visit: Payer: Self-pay | Admitting: Internal Medicine

## 2011-09-13 ENCOUNTER — Other Ambulatory Visit: Payer: Self-pay | Admitting: Internal Medicine

## 2011-09-14 ENCOUNTER — Other Ambulatory Visit: Payer: Medicare Other | Admitting: Lab

## 2011-09-14 ENCOUNTER — Ambulatory Visit (HOSPITAL_BASED_OUTPATIENT_CLINIC_OR_DEPARTMENT_OTHER): Payer: Medicare Other | Admitting: Oncology

## 2011-09-14 ENCOUNTER — Encounter: Payer: Self-pay | Admitting: Oncology

## 2011-09-14 VITALS — BP 134/70 | HR 82 | Temp 98.6°F | Ht 67.0 in | Wt 142.4 lb

## 2011-09-14 DIAGNOSIS — Z171 Estrogen receptor negative status [ER-]: Secondary | ICD-10-CM

## 2011-09-14 DIAGNOSIS — C50919 Malignant neoplasm of unspecified site of unspecified female breast: Secondary | ICD-10-CM | POA: Insufficient documentation

## 2011-09-14 HISTORY — DX: Malignant neoplasm of unspecified site of unspecified female breast: C50.919

## 2011-09-14 LAB — CBC WITH DIFFERENTIAL/PLATELET
Basophils Absolute: 0 10*3/uL (ref 0.0–0.1)
Eosinophils Absolute: 0.1 10*3/uL (ref 0.0–0.5)
HCT: 39.7 % (ref 34.8–46.6)
HGB: 12.9 g/dL (ref 11.6–15.9)
MCH: 24.8 pg — ABNORMAL LOW (ref 25.1–34.0)
MCV: 76.6 fL — ABNORMAL LOW (ref 79.5–101.0)
NEUT#: 4 10*3/uL (ref 1.5–6.5)
NEUT%: 62.7 % (ref 38.4–76.8)
RDW: 14.3 % (ref 11.2–14.5)
lymph#: 1.9 10*3/uL (ref 0.9–3.3)

## 2011-09-14 LAB — COMPREHENSIVE METABOLIC PANEL
Albumin: 4.3 g/dL (ref 3.5–5.2)
BUN: 20 mg/dL (ref 6–23)
CO2: 28 mEq/L (ref 19–32)
Calcium: 9.7 mg/dL (ref 8.4–10.5)
Chloride: 99 mEq/L (ref 96–112)
Glucose, Bld: 114 mg/dL — ABNORMAL HIGH (ref 70–99)
Potassium: 3.2 mEq/L — ABNORMAL LOW (ref 3.5–5.3)
Sodium: 139 mEq/L (ref 135–145)
Total Protein: 7.5 g/dL (ref 6.0–8.3)

## 2011-09-14 NOTE — Patient Instructions (Signed)
Dr Darrold Span will see you on as needed basis from here

## 2011-09-14 NOTE — Progress Notes (Signed)
OFFICE PROGRESS NOTE Date of Visit 09-13-2011 Physicians: M.Norins  INTERVAL HISTORY:  Patient is seen, alone for visit, in yearly follow up of her history of breast cancer, now on observation. 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 She had adjuvant adriamycin/cytoxan x 4 cycles at [facility] and has been followed since then on observation. Last bilateral mammograms were at [facility], with 6 mm simple cyst on right confirmed by [place], and no other mammographic findings of concern tho breast tissue is still dense. Patient is followed yearly and prn by Dr.Michael Norins; she does not see other physicians regularly now. 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 She has had no changes on breast self exam, no bleeding, no recent infectious illnesses, no respiratory or GI problems, no other pain, good energy and good appetite. She still exercises at gym at least twice weekly.  Remainder of 10 point Review of Systems negative.   Objective:  Vital signs in last 24 hours:  BP 134/70  Pulse 82  Temp(Src) 98.6 F (37 C) (Oral)  Ht 5\' 7"  (1.702 m)  Wt 142 lb 6.4 oz (64.592 kg)  BMI 22.30 kg/m2 This weight is stable from a year ago. Easily mobile, looks comfortable, very pleasant as always. HEENT:mucous membranes moist, pharynx normal without lesions. PERRL. Neck supple without obvious thyroid mass. LymphaticsCervical, supraclavicular, and axillary nodes normal.No inguinal adenopathy. Resp: clear to auscultation bilaterally and normal percussion bilaterally Cardio: regular rate and rhythm GI: soft, non-tender; bowel sounds normal; no masses,  no organomegaly Extremities: extremities normal, atraumatic, no cyanosis or edema Neuro:nonfocal Breasts: left with well-healed lumpectomy scar, no dominant masses or skin/nipple findings, axilla benign, no swelling LUE.  Right breast, axilla and RUE also unremarkable.  Lab Results:   Basename 09/14/11 1056  WBC 6.4  HGB 12.9  HCT 39.7  PLT 272  ANC 4.0. Differential not remarkable.  BMET Not repeated Studies/Results: Bilateral mammograms, additional views of right breast and [place] reports from [facility] 03-20-2012 in this EMR.  Medications: I have reviewed the patient's current medications.  Assessment/Plan:  1. History of stage 1 left breast carcinoma with features and treatment as above: she has no evidence of active disease now out 12 years from diagnosis. She and I are both comfortable with changing follow up at this office to prn, and she is aware that I can see her at any time if she or her physicians feel that I can be of help. With the previous chemotherapy, she should have CBC checked ~ yearly, as she would have with regular physical exam. She needs to continue yearly mammograms.        Cendy Oconnor P, MD   09/14/2011, 4:09 PM

## 2012-02-14 ENCOUNTER — Encounter: Payer: Self-pay | Admitting: Gastroenterology

## 2014-02-10 ENCOUNTER — Telehealth: Payer: Self-pay | Admitting: Internal Medicine

## 2014-02-10 ENCOUNTER — Other Ambulatory Visit: Payer: Self-pay | Admitting: Geriatric Medicine

## 2014-02-10 MED ORDER — CHLORTHALIDONE 25 MG PO TABS: ORAL_TABLET | ORAL | Status: DC

## 2014-02-10 NOTE — Telephone Encounter (Signed)
Pt called in, she has an appt to est with Dr Doug Sou but is completely out of blood pressure meds and wanted to know if she could get those refilled before she came in.  She is completely out and is concerned with not having them til the appt.   Please advise, Best number is home number   Thank you!

## 2014-02-10 NOTE — Telephone Encounter (Signed)
Patient aware and will go get her medication and will keep her apt on 9/24.

## 2014-02-13 ENCOUNTER — Other Ambulatory Visit: Payer: Self-pay | Admitting: Geriatric Medicine

## 2014-02-13 ENCOUNTER — Encounter: Payer: Self-pay | Admitting: Internal Medicine

## 2014-02-13 ENCOUNTER — Ambulatory Visit (INDEPENDENT_AMBULATORY_CARE_PROVIDER_SITE_OTHER): Payer: Medicare Other | Admitting: Internal Medicine

## 2014-02-13 VITALS — BP 124/78 | HR 77 | Temp 98.3°F | Resp 16 | Ht 67.0 in | Wt 148.4 lb

## 2014-02-13 DIAGNOSIS — Z23 Encounter for immunization: Secondary | ICD-10-CM

## 2014-02-13 DIAGNOSIS — Z86010 Personal history of colonic polyps: Secondary | ICD-10-CM

## 2014-02-13 DIAGNOSIS — E785 Hyperlipidemia, unspecified: Secondary | ICD-10-CM

## 2014-02-13 DIAGNOSIS — M810 Age-related osteoporosis without current pathological fracture: Secondary | ICD-10-CM

## 2014-02-13 DIAGNOSIS — G4700 Insomnia, unspecified: Secondary | ICD-10-CM

## 2014-02-13 DIAGNOSIS — C50919 Malignant neoplasm of unspecified site of unspecified female breast: Secondary | ICD-10-CM

## 2014-02-13 DIAGNOSIS — I10 Essential (primary) hypertension: Secondary | ICD-10-CM

## 2014-02-13 MED ORDER — IBANDRONATE SODIUM 150 MG PO TABS: 150.0000 mg | ORAL_TABLET | ORAL | Status: DC

## 2014-02-13 MED ORDER — RAMELTEON 8 MG PO TABS: 8.0000 mg | ORAL_TABLET | Freq: Every day | ORAL | Status: DC

## 2014-02-13 NOTE — Progress Notes (Signed)
Pre visit review using our clinic review tool, if applicable. No additional management support is needed unless otherwise documented below in the visit note. 

## 2014-02-13 NOTE — Assessment & Plan Note (Addendum)
Refill lovastatin check lipid panel.

## 2014-02-13 NOTE — Assessment & Plan Note (Signed)
BP good today. Continue chlorthalidone. Check BMP.

## 2014-02-13 NOTE — Progress Notes (Signed)
   Subjective:    Patient ID: [name] [name], female    DOB: 04-10-1946, 68 y.o.   MRN: [id number]  HPI The patient is a 68 YO female who is coming in today to establish care. She does have chronic insomnia which she thinks is a little better since she has started buying fresh meats and vegetables. She denies chest pains, SOB, nausea, vomiting, abdominal pain. She also wants to know if there are things she needs to catch up on.   Review of Systems  Constitutional: Negative for fever, activity change, appetite change and fatigue.  HENT: Negative.   Eyes: Negative.   Respiratory: Negative for cough, chest tightness, shortness of breath and wheezing.   Cardiovascular: Negative for chest pain, palpitations and leg swelling.  Gastrointestinal: Negative for abdominal pain, diarrhea, constipation and abdominal distention.  Genitourinary: Negative.   Musculoskeletal: Negative.   Neurological: Negative.   Psychiatric/Behavioral: Positive for sleep disturbance and dysphoric mood.      Objective:   Physical Exam  Constitutional: She is oriented to person, place, and time. She appears well-developed and well-nourished.  HENT:  Head: Normocephalic and atraumatic.  Eyes: EOM are normal.  Neck: Normal range of motion.  Cardiovascular: Normal rate and regular rhythm.   Pulmonary/Chest: Effort normal and breath sounds normal. No respiratory distress. She has no wheezes. She has no rales.  Abdominal: Soft. Bowel sounds are normal. She exhibits no distension. There is no tenderness. There is no rebound.  Neurological: She is alert and oriented to person, place, and time.  Skin: Skin is warm and dry.   Filed Vitals:   02/13/14 1314  BP: 124/78  Pulse: 77  Temp: 98.3 F (36.8 C)  TempSrc: Oral  Resp: 16  Height: 5\' 7"  (1.702 m)  Weight: 148 lb 6.4 oz (67.314 kg)  SpO2: 97%      Assessment & Plan:

## 2014-02-13 NOTE — Patient Instructions (Signed)
We will send in a medicine for your sleep that may help called ramelteon. Take it about 30-45 minutes before you want to go to bed and it will help you to relax and maybe help keep you from waking up at night.  You are doing well and we will have you come back tomorrow for your blood work.   Come back in about 1 year or call us sooner if you are having problems or are sick.  Insomnia Insomnia is frequent trouble falling and/or staying asleep. Insomnia can be a long term problem or a short term problem. Both are common. Insomnia can be a short term problem when the wakefulness is related to a certain stress or worry. Long term insomnia is often related to ongoing stress during waking hours and/or poor sleeping habits. Overtime, sleep deprivation itself can make the problem worse. Every little thing feels more severe because you are overtired and your ability to cope is decreased. CAUSES   Stress, anxiety, and depression.  Poor sleeping habits.  Distractions such as TV in the bedroom.  Naps close to bedtime.  Engaging in emotionally charged conversations before bed.  Technical reading before sleep.  Alcohol and other sedatives. They may make the problem worse. They can hurt normal sleep patterns and normal dream activity.  Stimulants such as caffeine for several hours prior to bedtime.  Pain syndromes and shortness of breath can cause insomnia.  Exercise late at night.  Changing time zones may cause sleeping problems (jet lag). It is sometimes helpful to have someone observe your sleeping patterns. They should look for periods of not breathing during the night (sleep apnea). They should also look to see how long those periods last. If you live alone or observers are uncertain, you can also be observed at a sleep clinic where your sleep patterns will be professionally monitored. Sleep apnea requires a checkup and treatment. Give your caregivers your medical history. Give your caregivers  observations your family has made about your sleep.  SYMPTOMS   Not feeling rested in the morning.  Anxiety and restlessness at bedtime.  Difficulty falling and staying asleep. TREATMENT   Your caregiver may prescribe treatment for an underlying medical disorders. Your caregiver can give advice or help if you are using alcohol or other drugs for self-medication. Treatment of underlying problems will usually eliminate insomnia problems.  Medications can be prescribed for short time use. They are generally not recommended for lengthy use.  Over-the-counter sleep medicines are not recommended for lengthy use. They can be habit forming.  You can promote easier sleeping by making lifestyle changes such as:  Using relaxation techniques that help with breathing and reduce muscle tension.  Exercising earlier in the day.  Changing your diet and the time of your last meal. No night time snacks.  Establish a regular time to go to bed.  Counseling can help with stressful problems and worry.  Soothing music and white noise may be helpful if there are background noises you cannot remove.  Stop tedious detailed work at least one hour before bedtime. HOME CARE INSTRUCTIONS   Keep a diary. Inform your caregiver about your progress. This includes any medication side effects. See your caregiver regularly. Take note of:  Times when you are asleep.  Times when you are awake during the night.  The quality of your sleep.  How you feel the next day. This information will help your caregiver care for you.  Get out of bed if you are  still awake after 15 minutes. Read or do some quiet activity. Keep the lights down. Wait until you feel sleepy and go back to bed.  Keep regular sleeping and waking hours. Avoid naps.  Exercise regularly.  Avoid distractions at bedtime. Distractions include watching television or engaging in any intense or detailed activity like attempting to balance the household  checkbook.  Develop a bedtime ritual. Keep a familiar routine of bathing, brushing your teeth, climbing into bed at the same time each night, listening to soothing music. Routines increase the success of falling to sleep faster.  Use relaxation techniques. This can be using breathing and muscle tension release routines. It can also include visualizing peaceful scenes. You can also help control troubling or intruding thoughts by keeping your mind occupied with boring or repetitive thoughts like the old concept of counting sheep. You can make it more creative like imagining planting one beautiful flower after another in your backyard garden.  During your day, work to eliminate stress. When this is not possible use some of the previous suggestions to help reduce the anxiety that accompanies stressful situations. MAKE SURE YOU:   Understand these instructions.  Will watch your condition.  Will get help right away if you are not doing well or get worse. Document Released: 05/06/2000 Document Revised: 08/01/2011 Document Reviewed: 06/06/2007 [facility] Patient Information 2015 [place], [place]. This information is not intended to replace advice given to you by your health care provider. Make sure you discuss any questions you have with your health care provider.

## 2014-02-13 NOTE — Assessment & Plan Note (Signed)
Refill boniva and check when last dexa was.

## 2014-02-13 NOTE — Assessment & Plan Note (Signed)
She has tried [place], melatonin in the past. Will trial remelteon. If not successful can other agent.

## 2014-02-13 NOTE — Assessment & Plan Note (Signed)
She is due for mammogram and encouraged her to return for that. No lumps or symptoms and she is many years out from her breast cancer without problem.

## 2014-02-13 NOTE — Assessment & Plan Note (Signed)
She is due several years ago for colonoscopy and reminded her to schedule that.

## 2014-02-14 ENCOUNTER — Telehealth: Payer: Self-pay | Admitting: Internal Medicine

## 2014-02-14 MED ORDER — MIRTAZAPINE 15 MG PO TBDP: 15.0000 mg | ORAL_TABLET | Freq: Every day | ORAL | Status: DC

## 2014-02-14 NOTE — Telephone Encounter (Signed)
Are there any other alternatives, Dr. Doug Sou?

## 2014-02-14 NOTE — Telephone Encounter (Signed)
Patient aware.

## 2014-02-14 NOTE — Telephone Encounter (Signed)
Pt called and said stated that she went to pick up meds and the pharmacist said that meds are going to be 270.00 the 1st time and 95.00 a month every month after.  She said that she can not afford that and pharmacist said that they do not make generic .  What can she do?     Thank you

## 2014-02-14 NOTE — Telephone Encounter (Signed)
Yes, we can try one of the other options. I will send in mirtazepine. She will take 1 pill before bedtime and may take 1-2 weeks for her to notice the full benefit.

## 2014-02-18 ENCOUNTER — Other Ambulatory Visit (INDEPENDENT_AMBULATORY_CARE_PROVIDER_SITE_OTHER): Payer: Medicare Other

## 2014-02-18 DIAGNOSIS — I10 Essential (primary) hypertension: Secondary | ICD-10-CM

## 2014-02-18 DIAGNOSIS — E785 Hyperlipidemia, unspecified: Secondary | ICD-10-CM

## 2014-02-18 DIAGNOSIS — G4700 Insomnia, unspecified: Secondary | ICD-10-CM

## 2014-02-18 LAB — BASIC METABOLIC PANEL
BUN: 26 mg/dL — ABNORMAL HIGH (ref 6–23)
CALCIUM: 9.4 mg/dL (ref 8.4–10.5)
CO2: 26 meq/L (ref 19–32)
CREATININE: 0.9 mg/dL (ref 0.4–1.2)
Chloride: 100 mEq/L (ref 96–112)
GFR: 78.72 mL/min (ref >60.00)
GLUCOSE: 106 mg/dL — AB (ref 70–99)
Potassium: 3.2 mEq/L — ABNORMAL LOW (ref 3.5–5.1)
SODIUM: 135 meq/L (ref 135–145)

## 2014-02-18 LAB — CBC
HEMATOCRIT: 39.2 % (ref 36.0–46.0)
Hemoglobin: 12.8 g/dL (ref 12.0–15.0)
MCHC: 32.7 g/dL (ref 30.0–36.0)
MCV: 73.9 fl — AB (ref 78.0–100.0)
PLATELETS: 273 10*3/uL (ref 150.0–400.0)
RBC: 5.3 Mil/uL — ABNORMAL HIGH (ref 3.87–5.11)
RDW: 14.4 % (ref 11.5–15.5)
WBC: 4.8 10*3/uL (ref 4.0–10.5)

## 2014-02-18 LAB — LIPID PANEL
CHOLESTEROL: 245 mg/dL — AB (ref 0–200)
HDL: 76.2 mg/dL (ref >39.00)
LDL Cholesterol: 157 mg/dL — ABNORMAL HIGH (ref 0–99)
NonHDL: 168.8
Total CHOL/HDL Ratio: 3
Triglycerides: 61 mg/dL (ref 0.0–149.0)
VLDL: 12.2 mg/dL (ref 0.0–40.0)

## 2014-02-18 LAB — TSH: TSH: 0.75 u[IU]/mL (ref 0.35–4.50)

## 2014-02-21 NOTE — Telephone Encounter (Signed)
She should take 1/2 pill at bedtime and try that. If still feeling dizzy can stop medicine.

## 2014-02-21 NOTE — Telephone Encounter (Signed)
Pt called in and said that this new med makes her feel funny in the morning.  She said that she was dizzy felt like she was going to fall.  She wanted me to let Dr Doug Sou know. She said that she also dont fill safe driving car.     Thank you

## 2014-02-21 NOTE — Telephone Encounter (Signed)
Patient aware.

## 2014-02-21 NOTE — Telephone Encounter (Signed)
Should patient stop the medication, or give it more time to work?

## 2014-02-26 ENCOUNTER — Telehealth: Payer: Self-pay | Admitting: Internal Medicine

## 2014-02-26 ENCOUNTER — Telehealth: Payer: Self-pay | Admitting: *Deleted

## 2014-02-26 NOTE — Telephone Encounter (Signed)
Left msg on triage stating tried to get her mammogram setup the facility is asking does she need a standard or 3D mammogram.../lmb

## 2014-02-26 NOTE — Telephone Encounter (Signed)
Patient would like to know if she can skip taking the MIRTAZAPINE for one night.  Please advise.

## 2014-02-27 NOTE — Telephone Encounter (Signed)
She needs standard mammogram.

## 2014-02-27 NOTE — Telephone Encounter (Signed)
Patient aware and will stop for one night.

## 2014-02-27 NOTE — Telephone Encounter (Signed)
Notified pt with md response.../lmb 

## 2014-02-27 NOTE — Telephone Encounter (Signed)
Yes, she can skip taking it for a day.

## 2014-02-27 NOTE — Telephone Encounter (Signed)
Is this ok?

## 2014-03-03 ENCOUNTER — Telehealth: Payer: Self-pay | Admitting: Internal Medicine

## 2014-03-03 NOTE — Telephone Encounter (Signed)
Patient would like to speak with Dr to discuss lab results.

## 2014-03-04 NOTE — Telephone Encounter (Signed)
Tried to reach patient to answer questions about previous labs. No answer - left voice mail.

## 2014-03-05 NOTE — Telephone Encounter (Signed)
Left message for patient to call me back. 

## 2014-03-05 NOTE — Telephone Encounter (Signed)
Left msg on triage returning Amy call...Johny Chess

## 2014-03-10 ENCOUNTER — Telehealth: Payer: Self-pay | Admitting: Internal Medicine

## 2014-03-10 ENCOUNTER — Other Ambulatory Visit: Payer: Self-pay | Admitting: Geriatric Medicine

## 2014-03-10 MED ORDER — CHLORTHALIDONE 25 MG PO TABS: ORAL_TABLET | ORAL | Status: DC

## 2014-03-10 NOTE — Telephone Encounter (Signed)
Patient is requesting refill on blood pressure med.  Patient uses pharmacy - [facility] on [address] [phone] PH#

## 2014-03-14 ENCOUNTER — Other Ambulatory Visit: Payer: Self-pay | Admitting: *Deleted

## 2014-03-14 MED ORDER — LOVASTATIN 20 MG PO TABS: 20.0000 mg | ORAL_TABLET | Freq: Every day | ORAL | Status: DC

## 2014-03-17 ENCOUNTER — Encounter: Payer: Self-pay | Admitting: Internal Medicine

## 2014-03-18 ENCOUNTER — Other Ambulatory Visit: Payer: Self-pay | Admitting: Geriatric Medicine

## 2014-03-18 MED ORDER — CHLORTHALIDONE 25 MG PO TABS: ORAL_TABLET | ORAL | Status: DC

## 2014-03-24 ENCOUNTER — Encounter: Payer: Self-pay | Admitting: Internal Medicine

## 2014-03-24 DIAGNOSIS — M8580 Other specified disorders of bone density and structure, unspecified site: Secondary | ICD-10-CM

## 2014-04-01 ENCOUNTER — Other Ambulatory Visit (INDEPENDENT_AMBULATORY_CARE_PROVIDER_SITE_OTHER): Payer: Medicare Other

## 2014-04-01 ENCOUNTER — Ambulatory Visit (INDEPENDENT_AMBULATORY_CARE_PROVIDER_SITE_OTHER): Payer: Medicare Other | Admitting: Internal Medicine

## 2014-04-01 ENCOUNTER — Encounter: Payer: Self-pay | Admitting: Internal Medicine

## 2014-04-01 VITALS — BP 128/88 | HR 88 | Temp 98.2°F | Wt 155.0 lb

## 2014-04-01 DIAGNOSIS — R350 Frequency of micturition: Secondary | ICD-10-CM

## 2014-04-01 DIAGNOSIS — I10 Essential (primary) hypertension: Secondary | ICD-10-CM

## 2014-04-01 DIAGNOSIS — E876 Hypokalemia: Secondary | ICD-10-CM

## 2014-04-01 DIAGNOSIS — N3281 Overactive bladder: Secondary | ICD-10-CM | POA: Insufficient documentation

## 2014-04-01 DIAGNOSIS — E785 Hyperlipidemia, unspecified: Secondary | ICD-10-CM

## 2014-04-01 DIAGNOSIS — M810 Age-related osteoporosis without current pathological fracture: Secondary | ICD-10-CM

## 2014-04-01 HISTORY — DX: Overactive bladder: N32.81

## 2014-04-01 HISTORY — DX: Hypokalemia: E87.6

## 2014-04-01 LAB — URINALYSIS, ROUTINE W REFLEX MICROSCOPIC
BILIRUBIN URINE: NEGATIVE
Ketones, ur: NEGATIVE
Leukocytes, UA: NEGATIVE
Nitrite: NEGATIVE
Specific Gravity, Urine: 1.01 (ref 1.000–1.030)
TOTAL PROTEIN, URINE-UPE24: NEGATIVE
Urine Glucose: NEGATIVE
Urobilinogen, UA: 0.2 (ref 0.0–1.0)
pH: 5.5 (ref 5.0–8.0)

## 2014-04-01 MED ORDER — LOSARTAN POTASSIUM 100 MG PO TABS: 100.0000 mg | ORAL_TABLET | Freq: Every day | ORAL | Status: DC

## 2014-04-01 MED ORDER — POTASSIUM CHLORIDE ER 10 MEQ PO TBCR: 10.0000 meq | EXTENDED_RELEASE_TABLET | Freq: Two times a day (BID) | ORAL | Status: DC

## 2014-04-01 NOTE — Assessment & Plan Note (Signed)
UA D/c diuretic

## 2014-04-01 NOTE — Assessment & Plan Note (Addendum)
On diet - not taking her Rx

## 2014-04-01 NOTE — Assessment & Plan Note (Signed)
D/c diuretic (low K and frequency) Start Losartan

## 2014-04-01 NOTE — Assessment & Plan Note (Signed)
2015 due to a diuretic (d/c'd) KCl x 2 weeks po

## 2014-04-01 NOTE — Progress Notes (Signed)
Pre visit review using our clinic review tool, if applicable. No additional management support is needed unless otherwise documented below in the visit note. 

## 2014-04-01 NOTE — Assessment & Plan Note (Addendum)
BDS at [place] D, exercises Pt declined Boniva

## 2014-04-07 NOTE — Progress Notes (Signed)
   Subjective:    Patient ID: [name] [name], female    DOB: 07/03/1945, 68 y.o.   MRN: [id number]  HPI  The patient presents to address her chronic hypertension, chronic dyslipidemia, low K C/o frequent urination  BP Readings from Last 3 Encounters:  04/01/14 128/88  02/13/14 124/78  09/14/11 134/70   Wt Readings from Last 3 Encounters:  04/01/14 155 lb (70.308 kg)  02/13/14 148 lb 6.4 oz (67.314 kg)  09/14/11 142 lb 6.4 oz (64.592 kg)      Review of Systems  Constitutional: Negative for chills, activity change, appetite change, fatigue and unexpected weight change.  HENT: Negative for congestion, mouth sores and sinus pressure.   Eyes: Negative for visual disturbance.  Respiratory: Negative for cough and chest tightness.   Gastrointestinal: Negative for nausea and abdominal pain.  Genitourinary: Positive for frequency. Negative for difficulty urinating and vaginal pain.  Musculoskeletal: Negative for back pain and gait problem.  Skin: Negative for pallor and rash.  Neurological: Negative for dizziness, tremors, weakness, numbness and headaches.  Psychiatric/Behavioral: Negative for confusion and sleep disturbance.       Objective:   Physical Exam  Constitutional: She appears well-developed. No distress.  HENT:  Head: Normocephalic.  Right Ear: External ear normal.  Left Ear: External ear normal.  Nose: Nose normal.  Mouth/Throat: Oropharynx is clear and moist.  Eyes: Conjunctivae are normal. Pupils are equal, round, and reactive to light. Right eye exhibits no discharge. Left eye exhibits no discharge.  Neck: Normal range of motion. Neck supple. No JVD present. No tracheal deviation present. No thyromegaly present.  Cardiovascular: Normal rate, regular rhythm and normal heart sounds.   Pulmonary/Chest: No stridor. No respiratory distress. She has no wheezes.  Abdominal: Soft. Bowel sounds are normal. She exhibits no distension and no mass. There is no tenderness. There  is no rebound and no guarding.  Musculoskeletal: She exhibits no edema or tenderness.  Lymphadenopathy:    She has no cervical adenopathy.  Neurological: She displays normal reflexes. No cranial nerve deficit. She exhibits normal muscle tone. Coordination normal.  Skin: No rash noted. No erythema.  Psychiatric: She has a normal mood and affect. Her behavior is normal. Judgment and thought content normal.   Lab Results  Component Value Date   WBC 4.8 02/18/2014   HGB 12.8 02/18/2014   HCT 39.2 02/18/2014   PLT 273.0 02/18/2014   GLUCOSE 106* 02/18/2014   CHOL 245* 02/18/2014   TRIG 61.0 02/18/2014   HDL 76.20 02/18/2014   LDLDIRECT 121.3 04/29/2010   LDLCALC 157* 02/18/2014   ALT 18 09/14/2011   AST 25 09/14/2011   NA 135 02/18/2014   K 3.2* 02/18/2014   CL 100 02/18/2014   CREATININE 0.9 02/18/2014   BUN 26* 02/18/2014   CO2 26 02/18/2014   TSH 0.75 02/18/2014   HGBA1C 6.4 10/26/2010        Assessment & Plan:

## 2014-04-22 ENCOUNTER — Ambulatory Visit (INDEPENDENT_AMBULATORY_CARE_PROVIDER_SITE_OTHER): Payer: Medicare Other | Admitting: Internal Medicine

## 2014-04-22 ENCOUNTER — Encounter: Payer: Self-pay | Admitting: Internal Medicine

## 2014-04-22 VITALS — BP 112/72 | HR 83 | Temp 98.7°F | Ht 67.0 in | Wt 159.2 lb

## 2014-04-22 DIAGNOSIS — I10 Essential (primary) hypertension: Secondary | ICD-10-CM

## 2014-04-22 DIAGNOSIS — R609 Edema, unspecified: Secondary | ICD-10-CM | POA: Insufficient documentation

## 2014-04-22 DIAGNOSIS — R7302 Impaired glucose tolerance (oral): Secondary | ICD-10-CM

## 2014-04-22 NOTE — Patient Instructions (Signed)
Your EKG was OK today  Please continue all other medications as before, and refills have been done if requested.  Please have the pharmacy call with any other refills you may need.  Please keep your appointments with your specialists as you may have planned  Please start compression stocking for the swelling with the prescription to use at a medical supply store, such as [facility] on [address] will be contacted regarding the referral for: echocardiogram  Please go to the LAB in the Basement (turn left off the elevator) for the tests to be done in the AM only (any day later this wk of your choice)  Please make appt to follow up with Dr Doug Sou in 3 wks

## 2014-04-22 NOTE — Assessment & Plan Note (Signed)
Per records pt did have diuretic stopped recently, now on losartan, and now with edema as above; ECG reviewed as per emr, ok for echo - r/o diastolic dysfxn, to consider cxr only or Renovasc u/s eval for now;  Ok for hormone levels in am, cont same meds for now, to start compression stockings, f/u with Dr Doug Sou in 3 weeks

## 2014-04-22 NOTE — Progress Notes (Signed)
Pre visit review using our clinic review tool, if applicable. No additional management support is needed unless otherwise documented below in the visit note. 

## 2014-04-22 NOTE — [place] Notes (Signed)
Subjective:    Patient ID: [name] [name], female    DOB: 09/01/1945, 68 y.o.   MRN: [id number]  HPI  Here with worsening ankle edema since last wk approx mon nov 23, mild but worse later in the day, feels like a brick and dragging her feet with the tighttness to walk.  Largely improved in the AM after lying down all night, and actually has used pillows to eleveate at night as well.  States actually has better diet recently, less meat/no pork, no table salt, does not eat much at all.  No prior hx of same.  Drinks plenty of fluids during the day since a pharmacist said to do this, in light of her potassium rx and losartan, maybe 4-5 glasses /day all fluids. No recent stopping fluid pill per pt, but did have one stopped per Dr Alain Marion and changed to losartan. . No hx of renal, liver, lung, heart disease.  No new medications.  Has ongoing low potassium for unclear reasons?  No recent mg check. UA without protein x 3 wks.  Ago. No hx of eval for aldosteronism/cushings or renovasc dz. Last EKG 2011 - NSR.  Last echo 2003 with EF 55-65% . Aortic valve mild calcified.  Has mult varicose veins both legs  Pt denies polydipsia, polyuria     Past Medical History  Diagnosis Date  . Sarcoidosis 01/26/2007    Annotation: in remission since 1992 Qualifier: Diagnosis of  By: Linda Hedges MD, [facility]inz Knuckles   . Breast cancer 09/14/2011    T1N0 left March 2001, treated with lumpectomy and 11 node axillary evaluation, ER/PR negative, HER2 3+. Adjuvant adriamycin/cytoxan X 4 cycles.   Marland Kitchen HYPERGLYCEMIA 10/18/2009    Qualifier: Diagnosis of  By: Linda Hedges MD, Heinz Knuckles   . Essential hypertension 05/26/2008    Chronic     . Dyslipidemia 10/18/2009    Mild     . Osteoporosis 01/26/2007    Chronic   BDS at [facility] Vit D, exercises Pt declined Boniva   . MITRAL VALVE PROLAPSE, HX OF 01/26/2007    Qualifier: Diagnosis of  By: Linda Hedges MD, [place], CHRONIC 08/15/2008    Qualifier: Diagnosis of  By: Linda Hedges MD, Heinz Knuckles   . Chronic  hypokalemia 04/01/2014    2015 due to a diuretic    No past surgical history on file.  reports that she has never smoked. She does not have any smokeless tobacco history on file. Her alcohol and drug histories are not on file. family history is not on file. No Known Allergies Current Outpatient Prescriptions on File Prior to Visit  Medication Sig Dispense Refill  . Calcium-Vitamin D 600-125 MG-UNIT TABS Take by mouth.      . Glucosamine-Chondroitin-MSM 500-400-125 MG TABS Take by mouth daily. 2 tablet daily     . losartan (COZAAR) 100 MG tablet Take 1 tablet (100 mg total) by mouth daily. 30 tablet 11  . mirtazapine (REMERON SOL-TAB) 15 MG disintegrating tablet Take 1 tablet (15 mg total) by mouth at bedtime. 30 tablet 2  . potassium chloride (KLOR-CON 10) 10 MEQ tablet Take 1 tablet (10 mEq total) by mouth 2 (two) times daily. 30 tablet 0   No current facility-administered medications on file prior to visit.     Review of Systems  Constitutional: Negative for unusual diaphoresis or other sweats  HENT: Negative for ringing in ear Eyes: Negative for double vision or worsening visual disturbance.  Respiratory: Negative for choking and  stridor.   Gastrointestinal: Negative for vomiting or other signifcant bowel change Genitourinary: Negative for hematuria or decreased urine volume.  Musculoskeletal: Negative for other MSK pain or swelling Skin: Negative for color change and worsening wound.  Neurological: Negative for tremors and numbness other than noted  Psychiatric/Behavioral: Negative for decreased concentration or agitation other than above       Objective:   Physical Exam BP 112/72 mmHg  Pulse 83  Temp(Src) 98.7 F (37.1 C) (Oral)  Ht 5' 7"  (1.702 m)  Wt 159 lb 4 oz (72.235 kg)  BMI 24.94 kg/m2  SpO2 97% VS noted,  Constitutional: Pt appears well-developed, well-nourished.  HENT: Head: NCAT.  Right Ear: External ear normal.  Left Ear: External ear normal.  Eyes: .  Pupils are equal, round, and reactive to light. Conjunctivae and EOM are normal Neck: Normal range of motion. Neck supple.  Cardiovascular: Normal rate and regular rhythm.   Pulmonary/Chest: Effort normal and breath sounds normal.  Abd:  Soft, NT, ND, + BS Neurological: Pt is alert. Not confused , motor grossly intact Skin: Skin is warm. No rash Psychiatric: Pt behavior is normal. No agitation. mild nervous    Assessment & Plan:

## 2014-04-23 ENCOUNTER — Telehealth: Payer: Self-pay | Admitting: Internal Medicine

## 2014-04-23 NOTE — Telephone Encounter (Signed)
Patient seen Dr. Jenny Reichmann yesterday.  She is currently taking Tylenol extra strength but is not helping.  Patient has high blood pressure.  She would like to know if she could take aleave.

## 2014-04-23 NOTE — Telephone Encounter (Signed)
Ok for this, thanks 

## 2014-04-25 ENCOUNTER — Other Ambulatory Visit (INDEPENDENT_AMBULATORY_CARE_PROVIDER_SITE_OTHER): Payer: Medicare Other

## 2014-04-25 DIAGNOSIS — R609 Edema, unspecified: Secondary | ICD-10-CM

## 2014-04-26 LAB — CORTISOL-AM, BLOOD: CORTISOL - AM: 5.8 ug/dL (ref 4.3–22.4)

## 2014-04-27 LAB — MAGNESIUM: MAGNESIUM: 2 mg/dL (ref 1.5–2.5)

## 2014-04-27 NOTE — Assessment & Plan Note (Signed)
stable overall by history and exam, recent data reviewed with pt, and pt to continue medical treatment as before,  to f/u any worsening symptoms or concerns Lab Results  Component Value Date   HGBA1C 6.4 10/26/2010    

## 2014-04-27 NOTE — Assessment & Plan Note (Signed)
stable overall by history and exam, recent data reviewed with pt, and pt to continue medical treatment as before,  to f/u any worsening symptoms or concerns BP Readings from Last 3 Encounters:  04/22/14 112/72  04/01/14 128/88  02/13/14 124/78

## 2014-04-28 LAB — ACTH: C206 ACTH: 14 pg/mL (ref 6–50)

## 2014-04-30 ENCOUNTER — Telehealth: Payer: Self-pay | Admitting: Internal Medicine

## 2014-04-30 LAB — ALDOSTERONE + RENIN ACTIVITY W/ RATIO
ALDO / PRA Ratio: 6.3 Ratio (ref 0.9–28.9)
ALDOSTERONE: 3 ng/dL
PRA LC/MS/MS: 0.48 ng/mL/h (ref 0.25–5.82)

## 2014-04-30 NOTE — Telephone Encounter (Signed)
Pt called in and is wanting someone to give her a call with her lab results.

## 2014-05-02 ENCOUNTER — Encounter: Payer: Self-pay | Admitting: Internal Medicine

## 2014-05-21 ENCOUNTER — Ambulatory Visit (HOSPITAL_BASED_OUTPATIENT_CLINIC_OR_DEPARTMENT_OTHER)
Admission: RE | Admit: 2014-05-21 | Discharge: 2014-05-21 | Disposition: A | Discharge status: Home / Self Care | Payer: Medicare Other | Source: Ambulatory Visit | Attending: Internal Medicine | Admitting: Internal Medicine

## 2014-05-21 DIAGNOSIS — E785 Hyperlipidemia, unspecified: Secondary | ICD-10-CM | POA: Insufficient documentation

## 2014-05-21 DIAGNOSIS — I341 Nonrheumatic mitral (valve) prolapse: Secondary | ICD-10-CM | POA: Diagnosis not present

## 2014-05-21 DIAGNOSIS — I272 Other secondary pulmonary hypertension: Secondary | ICD-10-CM | POA: Insufficient documentation

## 2014-05-21 DIAGNOSIS — R600 Localized edema: Secondary | ICD-10-CM | POA: Diagnosis present

## 2014-05-21 DIAGNOSIS — I080 Rheumatic disorders of both mitral and aortic valves: Secondary | ICD-10-CM | POA: Insufficient documentation

## 2014-05-21 DIAGNOSIS — I359 Nonrheumatic aortic valve disorder, unspecified: Secondary | ICD-10-CM

## 2014-05-21 DIAGNOSIS — R609 Edema, unspecified: Secondary | ICD-10-CM

## 2014-05-21 DIAGNOSIS — C50919 Malignant neoplasm of unspecified site of unspecified female breast: Secondary | ICD-10-CM | POA: Insufficient documentation

## 2014-05-21 DIAGNOSIS — I10 Essential (primary) hypertension: Secondary | ICD-10-CM | POA: Diagnosis not present

## 2014-05-21 NOTE — [place] Notes (Signed)
  Echocardiogram 2D Echocardiogram has been performed.  [name] [name] FRANCES 05/21/2014, 10:26 AM

## 2014-05-27 ENCOUNTER — Telehealth: Payer: Self-pay | Admitting: Internal Medicine

## 2014-05-27 NOTE — Telephone Encounter (Signed)
Generally losartan is a mild fluid pill and does not cause legs to swell. I notice that Dr. Jenny Reichmann has done some lab work up of her low potassium levels. Her previous blood pressure medicine is a stronger fluid pill but causes her potassium levels to be low. Ok to stop losartan but check her BP at home.

## 2014-05-27 NOTE — Telephone Encounter (Signed)
Spoke with patient and she will stop Losartan. She has an appt with Dr. Jenny Reichmann tomorrow and will keep it.

## 2014-05-27 NOTE — Telephone Encounter (Signed)
Pt very upset, requesting a call from assistant asap. Pt stopped taking losartan yesterday as she believes it is calling her ankles and feet to swell. BP is 140/80 normally lower than this. Pt's PCP is Dr Doug Sou but does not want appt with her. Pls advise. [phone], pt requests a call or [phone].

## 2014-05-28 ENCOUNTER — Ambulatory Visit (HOSPITAL_BASED_OUTPATIENT_CLINIC_OR_DEPARTMENT_OTHER): Payer: Medicare Other

## 2014-05-28 ENCOUNTER — Ambulatory Visit (INDEPENDENT_AMBULATORY_CARE_PROVIDER_SITE_OTHER): Payer: No Typology Code available for payment source | Admitting: Internal Medicine

## 2014-05-28 ENCOUNTER — Encounter: Payer: Self-pay | Admitting: Internal Medicine

## 2014-05-28 VITALS — BP 122/72 | HR 75 | Temp 98.6°F | Ht 67.0 in | Wt 160.8 lb

## 2014-05-28 DIAGNOSIS — R7302 Impaired glucose tolerance (oral): Secondary | ICD-10-CM

## 2014-05-28 DIAGNOSIS — I10 Essential (primary) hypertension: Secondary | ICD-10-CM

## 2014-05-28 DIAGNOSIS — M8580 Other specified disorders of bone density and structure, unspecified site: Secondary | ICD-10-CM

## 2014-05-28 DIAGNOSIS — R609 Edema, unspecified: Secondary | ICD-10-CM

## 2014-05-28 MED ORDER — HYDROCHLOROTHIAZIDE 12.5 MG PO CAPS: 12.5000 mg | ORAL_CAPSULE | Freq: Every day | ORAL | Status: DC

## 2014-05-28 NOTE — [place] Notes (Signed)
Subjective:    Patient ID: [name] [name], female    DOB: 11/09/1945, 69 y.o.   MRN: [id number]  HPI  Pt here for f/u, former pt of Dr Linda Hedges, now seeing Dr Doug Sou.  Frustrated quite a bit with her persistent bilat ankle edema, worse since off the chlorthalidone 25 mg in oct 2015, BP stable on the losartan 100 mg since then.  Pt denies chest pain, increased sob or doe, wheezing, orthopnea, PND, increased LE swelling, palpitations, dizziness or syncope. Recent Echo with normal EF, mild pulm HTN.  Pt denies new neurological symptoms such as new headache, or facial or extremity weakness or numbness   Plans to transfer to [employer].  Fearful, nervous and miserable ongoing per pt Past Medical History  Diagnosis Date  . Sarcoidosis 01/26/2007    Annotation: in remission since 1992 Qualifier: Diagnosis of  By: Linda Hedges MD, [facility]inz Knuckles   . Breast cancer 09/14/2011    T1N0 left March 2001, treated with lumpectomy and 11 node axillary evaluation, ER/PR negative, HER2 3+. Adjuvant adriamycin/cytoxan X 4 cycles.   Marland Kitchen HYPERGLYCEMIA 10/18/2009    Qualifier: Diagnosis of  By: Linda Hedges MD, Heinz Knuckles   . Essential hypertension 05/26/2008    Chronic     . Dyslipidemia 10/18/2009    Mild     . Osteoporosis 01/26/2007    Chronic   BDS at [facility] Vit D, exercises Pt declined Boniva   . MITRAL VALVE PROLAPSE, HX OF 01/26/2007    Qualifier: Diagnosis of  By: Linda Hedges MD, [place], CHRONIC 08/15/2008    Qualifier: Diagnosis of  By: Linda Hedges MD, Heinz Knuckles   . 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 Osteopenia 01/26/2007    Chronic   BDS at [facility] Vit D, exercises Pt declined Boniva    No past surgical history on file.  reports that she has never smoked. She does not have any smokeless tobacco history on file. Her alcohol and drug histories are not on file. family history is not on file. No Known Allergies Current Outpatient Prescriptions on File Prior to Visit  Medication Sig Dispense Refill  .  Calcium-Vitamin D 600-125 MG-UNIT TABS Take by mouth.      . Glucosamine-Chondroitin-MSM 500-400-125 MG TABS Take by mouth daily. 2 tablet daily     . losartan (COZAAR) 100 MG tablet Take 1 tablet (100 mg total) by mouth daily. 30 tablet 11  . mirtazapine (REMERON SOL-TAB) 15 MG disintegrating tablet Take 1 tablet (15 mg total) by mouth at bedtime. 30 tablet 2   No current facility-administered medications on file prior to visit.   Review of Systems  Constitutional: Negative for unusual diaphoresis or other sweats  HENT: Negative for ringing in ear Eyes: Negative for double vision or worsening visual disturbance.  Respiratory: Negative for choking and stridor.   Gastrointestinal: Negative for vomiting or other signifcant bowel change Genitourinary: Negative for hematuria or decreased urine volume.  Musculoskeletal: Negative for other MSK pain or swelling Skin: Negative for color change and worsening wound.  Neurological: Negative for tremors and numbness other than noted  Psychiatric/Behavioral: Negative for decreased concentration or agitation other than above       Objective:   Physical Exam BP 122/72 mmHg  Pulse 75  Temp(Src) 98.6 F (37 C) (Oral)  Ht 5' 7"  (1.702 m)  Wt 160 lb 12 oz (72.916 kg)  BMI 25.17 kg/m2  SpO2 97% VS noted,  Constitutional: Pt appears well-developed, well-nourished.  HENT: Head: NCAT.  Right Ear: External ear normal.  Left Ear: External ear normal.  Eyes: . Pupils are equal, round, and reactive to light. Conjunctivae and EOM are normal Neck: Normal range of motion. Neck supple.  Cardiovascular: Normal rate and regular rhythm.   Pulmonary/Chest: Effort normal and breath sounds without rales or wheezing.  Neurological: Pt is alert. Not confused , motor grossly intact Skin: 1 + ankle edema bilat Psychiatric: Pt behavior is normal. No agitation.     Assessment & Plan:

## 2014-05-28 NOTE — Assessment & Plan Note (Signed)
Pt also mentions osteoporosis and very fearful of fall and fx, declines bisphos due to risk of gum problem with her dental work,  Last t score -2.1 - does not qualify for prolia. To cont calcium

## 2014-05-28 NOTE — Progress Notes (Signed)
Pre visit review using our clinic review tool, if applicable. No additional management support is needed unless otherwise documented below in the visit note. 

## 2014-05-28 NOTE — Assessment & Plan Note (Signed)
stable overall by history and exam, recent data reviewed with pt, and pt to continue medical treatment as before,  to f/u any worsening symptoms or concerns Lab Results  Component Value Date   HGBA1C 6.4 10/26/2010

## 2014-05-28 NOTE — Assessment & Plan Note (Signed)
Persistent mild ankle edema, I still think most likely related to venous insuff, to cont the compression stockings as she does, will add microzide 12.5 qd, but would not pursue other evaluation at this time (recent echo, aldost/renin leves/labs neg).  Tried to reassure pt.

## 2014-05-28 NOTE — Patient Instructions (Signed)
Please take all new medication as prescribed - the fluid pill  Please continue all other medications as before, and refills have been done if requested.  Please have the pharmacy call with any other refills you may need.  Please keep your appointments with your specialists as you may have planned

## 2014-05-28 NOTE — Assessment & Plan Note (Signed)
stable overall by history and exam, recent data reviewed with pt, and pt to continue medical treatment as before,  to f/u any worsening symptoms or concerns BP Readings from Last 3 Encounters:  05/28/14 122/72  04/22/14 112/72  04/01/14 128/88

## 2014-07-22 ENCOUNTER — Telehealth: Payer: Self-pay | Admitting: Internal Medicine

## 2014-07-22 ENCOUNTER — Encounter: Payer: Self-pay | Admitting: Family Medicine

## 2014-07-22 ENCOUNTER — Ambulatory Visit (INDEPENDENT_AMBULATORY_CARE_PROVIDER_SITE_OTHER): Payer: Medicare Other | Admitting: Family Medicine

## 2014-07-22 ENCOUNTER — Ambulatory Visit: Payer: No Typology Code available for payment source | Admitting: Family Medicine

## 2014-07-22 VITALS — BP 132/68 | HR 84 | Temp 98.5°F | Ht 67.0 in | Wt 162.5 lb

## 2014-07-22 DIAGNOSIS — I10 Essential (primary) hypertension: Secondary | ICD-10-CM

## 2014-07-22 DIAGNOSIS — R51 Headache: Secondary | ICD-10-CM

## 2014-07-22 DIAGNOSIS — N3281 Overactive bladder: Secondary | ICD-10-CM

## 2014-07-22 DIAGNOSIS — R519 Headache, unspecified: Secondary | ICD-10-CM

## 2014-07-22 DIAGNOSIS — G4700 Insomnia, unspecified: Secondary | ICD-10-CM

## 2014-07-22 DIAGNOSIS — E785 Hyperlipidemia, unspecified: Secondary | ICD-10-CM

## 2014-07-22 DIAGNOSIS — R609 Edema, unspecified: Secondary | ICD-10-CM

## 2014-07-22 DIAGNOSIS — E782 Mixed hyperlipidemia: Secondary | ICD-10-CM

## 2014-07-22 LAB — CBC
HCT: 37.7 % (ref 36.0–46.0)
HEMOGLOBIN: 12.4 g/dL (ref 12.0–15.0)
MCHC: 32.7 g/dL (ref 30.0–36.0)
MCV: 75.1 fl — AB (ref 78.0–100.0)
PLATELETS: 279 10*3/uL (ref 150.0–400.0)
RBC: 5.02 Mil/uL (ref 3.87–5.11)
RDW: 14.8 % (ref 11.5–15.5)
WBC: 4.7 10*3/uL (ref 4.0–10.5)

## 2014-07-22 LAB — COMPREHENSIVE METABOLIC PANEL
ALBUMIN: 4.1 g/dL (ref 3.5–5.2)
ALT: 24 U/L (ref 0–35)
AST: 30 U/L (ref 0–37)
Alkaline Phosphatase: 90 U/L (ref 39–117)
BUN: 18 mg/dL (ref 6–23)
CALCIUM: 9.5 mg/dL (ref 8.4–10.5)
CHLORIDE: 104 meq/L (ref 96–112)
CO2: 27 meq/L (ref 19–32)
Creatinine, Ser: 0.78 mg/dL (ref 0.40–1.20)
GFR: 93.93 mL/min (ref >60.00)
Glucose, Bld: 89 mg/dL (ref 70–99)
Potassium: 3.7 mEq/L (ref 3.5–5.1)
SODIUM: 137 meq/L (ref 135–145)
Total Bilirubin: 0.4 mg/dL (ref 0.2–1.2)
Total Protein: 7.5 g/dL (ref 6.0–8.3)

## 2014-07-22 LAB — TSH: TSH: 0.94 u[IU]/mL (ref 0.35–4.50)

## 2014-07-22 LAB — LIPID PANEL
Cholesterol: 239 mg/dL — ABNORMAL HIGH (ref 0–200)
HDL: 76.4 mg/dL (ref >39.00)
LDL Cholesterol: 147 mg/dL — ABNORMAL HIGH (ref 0–99)
NonHDL: 162.6
Total CHOL/HDL Ratio: 3
Triglycerides: 78 mg/dL (ref 0.0–149.0)
VLDL: 15.6 mg/dL (ref 0.0–40.0)

## 2014-07-22 MED ORDER — TRIAMTERENE-HCTZ 37.5-25 MG PO TABS: ORAL_TABLET | ORAL | Status: DC

## 2014-07-22 NOTE — Assessment & Plan Note (Addendum)
Well controlled, no changes to meds. Encouraged heart healthy diet such as the DASH diet and exercise as tolerated. She is frustrated with increased edema since her HCTZ. She now understands that it was switched due to her low potassium and why that is important. We will try Maxzide 37.5/25 1/2 tab daily and recheck bp and cmp in 1 month

## 2014-07-22 NOTE — Patient Instructions (Addendum)
Doctor wants sleep study due to restless sleep, insomnia, am HA and HTN, pulmonary HTN Order sleep study or refer to pulmonology for consideration of sleep study  Encouraged increased hydration, 64 ounces of clear fluids daily. Minimize alcohol and caffeine. Eat small frequent meals with lean proteins and complex carbs. Avoid high and low blood sugars. Get adequate sleep, 7-8 hours a night. Needs exercise daily preferably in the morning. Kegel exercises twice daily Sleep Apnea  Sleep apnea is a sleep disorder characterized by abnormal pauses in breathing while you sleep. When your breathing pauses, the level of oxygen in your blood decreases. This causes you to move out of deep sleep and into light sleep. As a result, your quality of sleep is poor, and the system that carries your blood throughout your body (cardiovascular system) experiences stress. If sleep apnea remains untreated, the following conditions can develop:  High blood pressure (hypertension).  Coronary artery disease.  Inability to achieve or maintain an erection (impotence).  Impairment of your thought process (cognitive dysfunction). There are three types of sleep apnea: 1. Obstructive sleep apnea--Pauses in breathing during sleep because of a blocked airway. 2. Central sleep apnea--Pauses in breathing during sleep because the area of the brain that controls your breathing does not send the correct signals to the muscles that control breathing. 3. Mixed sleep apnea--A combination of both obstructive and central sleep apnea. RISK FACTORS The following risk factors can increase your risk of developing sleep apnea:  Being overweight.  Smoking.  Having narrow passages in your nose and throat.  Being of older age.  Being female.  Alcohol use.  Sedative and tranquilizer use.  Ethnicity. Among individuals younger than 35 years, African Americans are at increased risk of sleep apnea. SYMPTOMS   Difficulty staying  asleep.  Daytime sleepiness and fatigue.  Loss of energy.  Irritability.  Loud, heavy snoring.  Morning headaches.  Trouble concentrating.  Forgetfulness.  Decreased interest in sex. DIAGNOSIS  In order to diagnose sleep apnea, your caregiver will perform a physical examination. Your caregiver may suggest that you take a home sleep test. Your caregiver may also recommend that you spend the night in a sleep lab. In the sleep lab, several monitors record information about your heart, lungs, and brain while you sleep. Your leg and arm movements and blood oxygen level are also recorded. TREATMENT The following actions may help to resolve mild sleep apnea:  Sleeping on your side.   Using a decongestant if you have nasal congestion.   Avoiding the use of depressants, including alcohol, sedatives, and narcotics.   Losing weight and modifying your diet if you are overweight. There also are devices and treatments to help open your airway:  Oral appliances. These are custom-made mouthpieces that shift your lower jaw forward and slightly open your bite. This opens your airway.  Devices that create positive airway pressure. This positive pressure "splints" your airway open to help you breathe better during sleep. The following devices create positive airway pressure:  Continuous positive airway pressure (CPAP) device. The CPAP device creates a continuous level of air pressure with an air pump. The air is delivered to your airway through a mask while you sleep. This continuous pressure keeps your airway open.  Nasal expiratory positive airway pressure (EPAP) device. The EPAP device creates positive air pressure as you exhale. The device consists of single-use valves, which are inserted into each nostril and held in place by adhesive. The valves create very little resistance when you  inhale but create much more resistance when you exhale. That increased resistance creates the positive  airway pressure. This positive pressure while you exhale keeps your airway open, making it easier to breath when you inhale again.  Bilevel positive airway pressure (BPAP) device. The BPAP device is used mainly in patients with central sleep apnea. This device is similar to the CPAP device because it also uses an air pump to deliver continuous air pressure through a mask. However, with the BPAP machine, the pressure is set at two different levels. The pressure when you exhale is lower than the pressure when you inhale.  Surgery. Typically, surgery is only done if you cannot comply with less invasive treatments or if the less invasive treatments do not improve your condition. Surgery involves removing excess tissue in your airway to create a wider passage way. Document Released: 04/29/2002 Document Revised: 09/03/2012 Document Reviewed: 09/15/2011 [facility] Patient Information 2015 [place], [place]. This information is not intended to replace advice given to you by your health care provider. Make sure you discuss any questions you have with your health care provider.

## 2014-07-22 NOTE — Progress Notes (Signed)
Pre visit review using our clinic review tool, if applicable. No additional management support is needed unless otherwise documented below in the visit note. 

## 2014-07-23 ENCOUNTER — Encounter: Payer: Self-pay | Admitting: Family Medicine

## 2014-07-23 DIAGNOSIS — R51 Headache: Secondary | ICD-10-CM

## 2014-07-23 DIAGNOSIS — R519 Headache, unspecified: Secondary | ICD-10-CM

## 2014-07-23 HISTORY — DX: Headache, unspecified: R51.9

## 2014-07-23 NOTE — Assessment & Plan Note (Signed)
Encouraged heart healthy diet, increase exercise, avoid trans fats, consider a krill oil cap daily 

## 2014-07-23 NOTE — Assessment & Plan Note (Addendum)
Discussed at length, has trouble falling and staying asleep. Discussed possibility of a sleep study. She declines for now but may consider in future. Remeron is helpful but she feels tired all the next day try taking 1/4 to 1/2 tab instead of a whole tab.

## 2014-07-23 NOTE — Assessment & Plan Note (Signed)
Encouraged DASH diet and elevated feet as able

## 2014-07-23 NOTE — Progress Notes (Signed)
[name] [name]  [id number] 1946-05-03 07/23/2014      Progress Note-Follow Up  Subjective  Chief Complaint  Chief Complaint  Patient presents with  . Hypertension    HPI  Patient is a 69 y.o. female in today for routine medical care. Patient in today for initiation of care. She is frustrated with increasing peripheral edema she feels has worsened since switching from hydrochlorothiazide to losartan. She also notes some frequent headaches sometimes worse in the morning. She'll long history of frequent urination but has recently been seen at urgent care and they have confirmed her diagnosis of overactive bladder and they were able to rule out urinary tract infection. She denies hematuria or dysuria. She denies fevers, chills, abdominal or worsening back pain. She is struggling with ongoing low back pain and pain in the lower extremities especially when walking on stairs. It is worse at the end of the day and somewhat better first thing in the morning. No recent falls or injury. Denies CP/palp/SOB/HA/congestion/fevers/GI or GU c/o. Taking meds as prescribed  Past Medical History  Diagnosis Date  . Sarcoidosis 01/26/2007    Annotation: in remission since 1992 Qualifier: Diagnosis of  By: Linda Hedges MD, Heinz Knuckles   . Breast cancer 09/14/2011    T1N0 left March 2001, treated with lumpectomy and 11 node axillary evaluation, ER/PR negative, HER2 3+. Adjuvant adriamycin/cytoxan X 4 cycles.   Marland Kitchen HYPERGLYCEMIA 10/18/2009    Qualifier: Diagnosis of  By: Linda Hedges MD, Heinz Knuckles   . Essential hypertension 05/26/2008    Chronic     . Dyslipidemia 10/18/2009    Mild     . Osteoporosis 01/26/2007    Chronic   BDS at [facility] Vit D, exercises Pt declined Boniva   . MITRAL VALVE PROLAPSE, HX OF 01/26/2007    Qualifier: Diagnosis of  By: Linda Hedges MD, [place], CHRONIC 08/15/2008    Qualifier: Diagnosis of  By: Linda Hedges MD, Heinz Knuckles   . 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 Osteopenia  01/26/2007    Chronic   BDS at [facility] Vit D, exercises Pt declined Boniva   . Overactive bladder 04/01/2014    Worse 2015     No past surgical history on file.  No family history on file.  History   Social History  . Marital Status: Single    Spouse Name: N/A  . Number of Children: N/A  . Years of Education: N/A   Occupational History  . Not on file.   Social History Main Topics  . Smoking status: Never Smoker   . Smokeless tobacco: Not on file  . Alcohol Use: Not on file  . Drug Use: Not on file  . Sexual Activity: Not on file   Other Topics Concern  . Not on file   Social History Narrative    Current Outpatient Prescriptions on File Prior to Visit  Medication Sig Dispense Refill  . Calcium-Vitamin D 600-125 MG-UNIT TABS Take by mouth.      . Glucosamine-Chondroitin-MSM 500-400-125 MG TABS Take by mouth daily. 2 tablet daily     . mirtazapine (REMERON SOL-TAB) 15 MG disintegrating tablet Take 1 tablet (15 mg total) by mouth at bedtime. (Patient not taking: Reported on 07/22/2014) 30 tablet 2   No current facility-administered medications on file prior to visit.    No Known Allergies  Review of Systems  Review of Systems  Constitutional: Negative for fever and malaise/fatigue.  HENT: Negative for congestion.   Eyes: Negative for discharge.  Respiratory: Negative for shortness of breath.   Cardiovascular: Negative for chest pain, palpitations and leg swelling.  Gastrointestinal: Negative for nausea, abdominal pain and diarrhea.  Genitourinary: Negative for dysuria.  Musculoskeletal: Negative for falls.  Skin: Negative for rash.  Neurological: Negative for loss of consciousness and headaches.  Endo/Heme/Allergies: Negative for polydipsia.  Psychiatric/Behavioral: Negative for depression and suicidal ideas. The patient is not nervous/anxious and does not have insomnia.     Objective  BP 132/68 mmHg  Pulse 84  Temp(Src) 98.5 F (36.9 C) (Oral)  Ht 5' 7"   (1.702 m)  Wt 162 lb 8 oz (73.71 kg)  BMI 25.45 kg/m2  SpO2 96%  Physical Exam  Physical Exam  Constitutional: She is oriented to person, place, and time and well-developed, well-nourished, and in no distress. No distress.  HENT:  Head: Normocephalic and atraumatic.  Eyes: Conjunctivae are normal.  Neck: Neck supple. No thyromegaly present.  Cardiovascular: Normal rate and regular rhythm.   Murmur heard. Pulmonary/Chest: Effort normal and breath sounds normal. She has no wheezes.  Abdominal: She exhibits no distension and no mass.  Musculoskeletal: She exhibits no edema.  Lymphadenopathy:    She has no cervical adenopathy.  Neurological: She is alert and oriented to person, place, and time.  Skin: Skin is warm and dry. No rash noted. She is not diaphoretic.  Psychiatric: Memory, affect and judgment normal.    Lab Results  Component Value Date   TSH 0.94 07/22/2014   Lab Results  Component Value Date   WBC 4.7 07/22/2014   HGB 12.4 07/22/2014   HCT 37.7 07/22/2014   MCV 75.1* 07/22/2014   PLT 279.0 07/22/2014   Lab Results  Component Value Date   CREATININE 0.78 07/22/2014   BUN 18 07/22/2014   NA 137 07/22/2014   K 3.7 07/22/2014   CL 104 07/22/2014   CO2 27 07/22/2014   Lab Results  Component Value Date   ALT 24 07/22/2014   AST 30 07/22/2014   ALKPHOS 90 07/22/2014   BILITOT 0.4 07/22/2014   Lab Results  Component Value Date   CHOL 239* 07/22/2014   Lab Results  Component Value Date   HDL 76.40 07/22/2014   Lab Results  Component Value Date   LDLCALC 147* 07/22/2014   Lab Results  Component Value Date   TRIG 78.0 07/22/2014   Lab Results  Component Value Date   CHOLHDL 3 07/22/2014     Assessment & Plan  Essential hypertension Well controlled, no changes to meds. Encouraged heart healthy diet such as the DASH diet and exercise as tolerated. She is frustrated with increased edema since her HCTZ. She now understands that it was switched  due to her low potassium and why that is important. We will try Maxzide 37.5/25 1/2 tab daily and recheck bp and cmp in 1 month   INSOMNIA, CHRONIC Discussed at length, has trouble falling and staying asleep. Discussed possibility of a sleep study. She declines for now but may consider in future. Remeron is helpful but she feels tired all the next day try taking 1/4 to 1/2 tab instead of a whole tab.   Overactive bladder Patient urinalysis at urgent care normal per patient. She declines medications at this time, encouraged Kegel's twice daily   Peripheral edema Encouraged DASH diet and elevated feet as able   Dyslipidemia Encouraged heart healthy diet, increase exercise, avoid trans fats, consider a krill oil cap  daily   Headache Encouraged increased hydration, 64 ounces of clear fluids daily. Minimize alcohol and caffeine. Eat small frequent meals with lean proteins and complex carbs. Avoid high and low blood sugars. Get adequate sleep, 7-8 hours a night. Needs exercise daily preferably in the morning.

## 2014-07-23 NOTE — Assessment & Plan Note (Signed)
Encouraged increased hydration, 64 ounces of clear fluids daily. Minimize alcohol and caffeine. Eat small frequent meals with lean proteins and complex carbs. Avoid high and low blood sugars. Get adequate sleep, 7-8 hours a night. Needs exercise daily preferably in the morning.  

## 2014-07-23 NOTE — Assessment & Plan Note (Addendum)
Patient urinalysis at urgent care normal per patient. She declines medications at this time, encouraged Kegel's twice daily

## 2014-07-24 NOTE — Telephone Encounter (Signed)
error 

## 2014-08-03 ENCOUNTER — Encounter: Payer: Self-pay | Admitting: Family Medicine

## 2014-08-06 ENCOUNTER — Encounter: Payer: Self-pay | Admitting: Family Medicine

## 2014-08-12 MED ORDER — BONIVA 150 MG PO TABS: 150.0000 mg | ORAL_TABLET | ORAL | Status: DC

## 2014-08-12 NOTE — Telephone Encounter (Signed)
Med filled.  

## 2014-08-13 ENCOUNTER — Telehealth: Payer: Self-pay | Admitting: Family Medicine

## 2014-08-13 NOTE — Telephone Encounter (Signed)
Could yo please discuss this with pt?

## 2014-08-13 NOTE — Telephone Encounter (Signed)
Spoke with patient who was concerned that her new Rx had a different name than she remembered.   Informed patient that it was generic name (she was familiar with brand name).  Patient stated understanding.

## 2014-08-13 NOTE — Telephone Encounter (Signed)
Caller name:Liou Lashanna Relation to ST:MHDQ Call back [phone] Pharmacy:  Reason for call: pt would like for you to call her states she has a question regarding her med, states you left her an email message.

## 2014-08-28 ENCOUNTER — Encounter: Payer: Self-pay | Admitting: Family Medicine

## 2014-09-03 ENCOUNTER — Telehealth: Payer: Self-pay | Admitting: Family Medicine

## 2014-09-03 NOTE — Telephone Encounter (Signed)
Caller name:Mccaffery Emmagrace Relation to VS:YVGC Call back number:[phone] Pharmacy:  Reason for call: pt states dr. [name] informed her that she would send the referral for her to dr. Fuller Plan, to have a colonoscopy, pt just following up, states she will make the appt after dr. [name] does the referral

## 2014-09-04 ENCOUNTER — Other Ambulatory Visit: Payer: Self-pay | Admitting: Family Medicine

## 2014-09-04 DIAGNOSIS — Z1211 Encounter for screening for malignant neoplasm of colon: Secondary | ICD-10-CM

## 2014-09-04 NOTE — Telephone Encounter (Signed)
Referral done

## 2014-09-05 NOTE — Telephone Encounter (Signed)
Called left a detailed message referral requested has been done.

## 2014-09-08 ENCOUNTER — Encounter: Payer: Self-pay | Admitting: Gastroenterology

## 2014-09-09 ENCOUNTER — Ambulatory Visit (INDEPENDENT_AMBULATORY_CARE_PROVIDER_SITE_OTHER): Payer: Medicare Other | Admitting: Family Medicine

## 2014-09-09 ENCOUNTER — Encounter: Payer: Self-pay | Admitting: Family Medicine

## 2014-09-09 VITALS — BP 126/75 | HR 70 | Temp 98.7°F | Ht 67.0 in | Wt 162.5 lb

## 2014-09-09 DIAGNOSIS — I10 Essential (primary) hypertension: Secondary | ICD-10-CM | POA: Diagnosis not present

## 2014-09-09 DIAGNOSIS — G609 Hereditary and idiopathic neuropathy, unspecified: Secondary | ICD-10-CM | POA: Diagnosis not present

## 2014-09-09 DIAGNOSIS — R609 Edema, unspecified: Secondary | ICD-10-CM

## 2014-09-09 DIAGNOSIS — E782 Mixed hyperlipidemia: Secondary | ICD-10-CM

## 2014-09-09 DIAGNOSIS — M8580 Other specified disorders of bone density and structure, unspecified site: Secondary | ICD-10-CM

## 2014-09-09 DIAGNOSIS — E785 Hyperlipidemia, unspecified: Secondary | ICD-10-CM

## 2014-09-09 DIAGNOSIS — G4700 Insomnia, unspecified: Secondary | ICD-10-CM

## 2014-09-09 DIAGNOSIS — G479 Sleep disorder, unspecified: Secondary | ICD-10-CM

## 2014-09-09 DIAGNOSIS — R0683 Snoring: Secondary | ICD-10-CM

## 2014-09-09 DIAGNOSIS — R739 Hyperglycemia, unspecified: Secondary | ICD-10-CM

## 2014-09-09 DIAGNOSIS — R202 Paresthesia of skin: Secondary | ICD-10-CM

## 2014-09-09 DIAGNOSIS — R7302 Impaired glucose tolerance (oral): Secondary | ICD-10-CM

## 2014-09-09 MED ORDER — TRIAMTERENE-HCTZ 37.5-25 MG PO TABS: ORAL_TABLET | ORAL | Status: DC

## 2014-09-09 NOTE — Patient Instructions (Signed)
Sleep Apnea  Sleep apnea is a sleep disorder characterized by abnormal pauses in breathing while you sleep. When your breathing pauses, the level of oxygen in your blood decreases. This causes you to move out of deep sleep and into light sleep. As a result, your quality of sleep is poor, and the system that carries your blood throughout your body (cardiovascular system) experiences stress. If sleep apnea remains untreated, the following conditions can develop:  High blood pressure (hypertension).  Coronary artery disease.  Inability to achieve or maintain an erection (impotence).  Impairment of your thought process (cognitive dysfunction). There are three types of sleep apnea: 1. Obstructive sleep apnea--Pauses in breathing during sleep because of a blocked airway. 2. Central sleep apnea--Pauses in breathing during sleep because the area of the brain that controls your breathing does not send the correct signals to the muscles that control breathing. 3. Mixed sleep apnea--A combination of both obstructive and central sleep apnea. RISK FACTORS The following risk factors can increase your risk of developing sleep apnea:  Being overweight.  Smoking.  Having narrow passages in your nose and throat.  Being of older age.  Being female.  Alcohol use.  Sedative and tranquilizer use.  Ethnicity. Among individuals younger than 35 years, African Americans are at increased risk of sleep apnea. SYMPTOMS   Difficulty staying asleep.  Daytime sleepiness and fatigue.  Loss of energy.  Irritability.  Loud, heavy snoring.  Morning headaches.  Trouble concentrating.  Forgetfulness.  Decreased interest in sex. DIAGNOSIS  In order to diagnose sleep apnea, your caregiver will perform a physical examination. Your caregiver may suggest that you take a home sleep test. Your caregiver may also recommend that you spend the night in a sleep lab. In the sleep lab, several monitors record  information about your heart, lungs, and brain while you sleep. Your leg and arm movements and blood oxygen level are also recorded. TREATMENT The following actions may help to resolve mild sleep apnea:  Sleeping on your side.   Using a decongestant if you have nasal congestion.   Avoiding the use of depressants, including alcohol, sedatives, and narcotics.   Losing weight and modifying your diet if you are overweight. There also are devices and treatments to help open your airway:  Oral appliances. These are custom-made mouthpieces that shift your lower jaw forward and slightly open your bite. This opens your airway.  Devices that create positive airway pressure. This positive pressure "splints" your airway open to help you breathe better during sleep. The following devices create positive airway pressure:  Continuous positive airway pressure (CPAP) device. The CPAP device creates a continuous level of air pressure with an air pump. The air is delivered to your airway through a mask while you sleep. This continuous pressure keeps your airway open.  Nasal expiratory positive airway pressure (EPAP) device. The EPAP device creates positive air pressure as you exhale. The device consists of single-use valves, which are inserted into each nostril and held in place by adhesive. The valves create very little resistance when you inhale but create much more resistance when you exhale. That increased resistance creates the positive airway pressure. This positive pressure while you exhale keeps your airway open, making it easier to breath when you inhale again.  Bilevel positive airway pressure (BPAP) device. The BPAP device is used mainly in patients with central sleep apnea. This device is similar to the CPAP device because it also uses an air pump to deliver continuous air pressure   through a mask. However, with the BPAP machine, the pressure is set at two different levels. The pressure when you  exhale is lower than the pressure when you inhale.  Surgery. Typically, surgery is only done if you cannot comply with less invasive treatments or if the less invasive treatments do not improve your condition. Surgery involves removing excess tissue in your airway to create a wider passage way. Document Released: 04/29/2002 Document Revised: 09/03/2012 Document Reviewed: 09/15/2011 ExitCare Patient Information 2015 [employer]. This information is not intended to replace advice given to you by your health care provider. Make sure you discuss any questions you have with your health care provider.  

## 2014-09-09 NOTE — Progress Notes (Signed)
Pre visit review using our clinic review tool, if applicable. No additional management support is needed unless otherwise documented below in the visit note. 

## 2014-09-10 ENCOUNTER — Other Ambulatory Visit (INDEPENDENT_AMBULATORY_CARE_PROVIDER_SITE_OTHER): Payer: Medicare Other

## 2014-09-10 DIAGNOSIS — R739 Hyperglycemia, unspecified: Secondary | ICD-10-CM | POA: Diagnosis not present

## 2014-09-10 DIAGNOSIS — G609 Hereditary and idiopathic neuropathy, unspecified: Secondary | ICD-10-CM

## 2014-09-10 DIAGNOSIS — R202 Paresthesia of skin: Secondary | ICD-10-CM

## 2014-09-10 LAB — COMPREHENSIVE METABOLIC PANEL
ALT: 23 U/L (ref 0–35)
AST: 29 U/L (ref 0–37)
Albumin: 4 g/dL (ref 3.5–5.2)
Alkaline Phosphatase: 86 U/L (ref 39–117)
BILIRUBIN TOTAL: 0.4 mg/dL (ref 0.2–1.2)
BUN: 21 mg/dL (ref 6–23)
CALCIUM: 9.3 mg/dL (ref 8.4–10.5)
CHLORIDE: 105 meq/L (ref 96–112)
CO2: 30 meq/L (ref 19–32)
CREATININE: 0.82 mg/dL (ref 0.40–1.20)
GFR: 88.63 mL/min (ref >60.00)
Glucose, Bld: 91 mg/dL (ref 70–99)
Potassium: 3.7 mEq/L (ref 3.5–5.1)
SODIUM: 139 meq/L (ref 135–145)
TOTAL PROTEIN: 7.2 g/dL (ref 6.0–8.3)

## 2014-09-10 LAB — VITAMIN B12: VITAMIN B 12: 493 pg/mL (ref 211–911)

## 2014-09-10 LAB — HEMOGLOBIN A1C: Hgb A1c MFr Bld: 6.5 % (ref 4.6–6.5)

## 2014-09-12 LAB — ZINC: Zinc: 74 ug/dL (ref 60–130)

## 2014-09-14 ENCOUNTER — Encounter: Payer: Self-pay | Admitting: Family Medicine

## 2014-09-14 NOTE — Assessment & Plan Note (Signed)
hgba1c acceptable, minimize simple carbs. Increase exercise as tolerated. Continue current meds 

## 2014-09-14 NOTE — Assessment & Plan Note (Signed)
Some leg pain is noted at ankles, is encouraged to elevate feet above heart several times a day. Minimize sodium and report if pain is worsening.

## 2014-09-14 NOTE — Assessment & Plan Note (Signed)
Encouraged heart healthy diet, increase exercise, avoid trans fats, consider a krill oil cap daily 

## 2014-09-14 NOTE — Assessment & Plan Note (Signed)
Tolerates Boniva. Encouraged calcium and vitamin D supplements

## 2014-09-14 NOTE — Assessment & Plan Note (Signed)
Well controlled, no changes to meds. Encouraged heart healthy diet such as the DASH diet and exercise as tolerated.  °

## 2014-09-14 NOTE — Progress Notes (Signed)
[name] [name]  [id number] 1946/03/04 09/14/2014      Progress Note-Follow Up  Subjective  Chief Complaint  Chief Complaint  Patient presents with  . Follow-up    HPI  Patient is a 69 y.o. female in today for routine medical care. Patient is in today for follow-up. Continues to struggle with mild pedal edema and some discomfort in her ankles and legs. Notes the pain is mostly from her knees to her feet. It is intermittent and worse at the end of the day. No redness or warmth. No recent injury. Acknowledges some fatigue and snoring but denies any other acute complaints. Denies CP/palp/SOB/HA/congestion/fevers/GI or GU c/o. Taking meds as prescribed  Past Medical History  Diagnosis Date  . Sarcoidosis 01/26/2007    Annotation: in remission since 1992 Qualifier: Diagnosis of  By: Linda Hedges MD, Heinz Knuckles   . Breast cancer 09/14/2011    T1N0 left March 2001, treated with lumpectomy and 11 node axillary evaluation, ER/PR negative, HER2 3+. Adjuvant adriamycin/cytoxan X 4 cycles.   Marland Kitchen HYPERGLYCEMIA 10/18/2009    Qualifier: Diagnosis of  By: Linda Hedges MD, Heinz Knuckles   . Essential hypertension 05/26/2008    Chronic     . Dyslipidemia 10/18/2009    Mild     . Osteoporosis 01/26/2007    Chronic   BDS at [facility] Vit D, exercises Pt declined Boniva   . MITRAL VALVE PROLAPSE, HX OF 01/26/2007    Qualifier: Diagnosis of  By: Linda Hedges MD, [place], CHRONIC 08/15/2008    Qualifier: Diagnosis of  By: Linda Hedges MD, Heinz Knuckles   . 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 Osteopenia 01/26/2007    Chronic   BDS at [facility] Vit D, exercises Pt declined Boniva   . Overactive bladder 04/01/2014    Worse 2015   . Headache 07/23/2014    History reviewed. No pertinent past surgical history.  History reviewed. No pertinent family history.  History   Social History  . Marital Status: Single    Spouse Name: N/A  . Number of Children: N/A  . Years of Education: N/A   Occupational History  .  Not on file.   Social History Main Topics  . Smoking status: Never Smoker   . Smokeless tobacco: Not on file  . Alcohol Use: Not on file  . Drug Use: Not on file  . Sexual Activity: Not on file   Other Topics Concern  . Not on file   Social History Narrative    Current Outpatient Prescriptions on File Prior to Visit  Medication Sig Dispense Refill  . BONIVA 150 MG tablet Take 1 tablet (150 mg total) by mouth every 30 (thirty) days. Take in the morning with a full glass of water, on an empty stomach, and do not take anything else by mouth or lie down for the next 30 min. 3 tablet 3  . Calcium-Vitamin D 600-125 MG-UNIT TABS Take by mouth.      . Glucosamine-Chondroitin-MSM 500-400-125 MG TABS Take by mouth daily. 2 tablet daily      No current facility-administered medications on file prior to visit.    No Known Allergies  Review of Systems  Review of Systems  Constitutional: Positive for malaise/fatigue. Negative for fever.  HENT: Negative for congestion.   Eyes: Negative for discharge.  Respiratory: Negative for shortness of breath.   Cardiovascular: Positive for leg swelling. Negative for chest pain and palpitations.  Gastrointestinal:  Negative for nausea, abdominal pain and diarrhea.  Genitourinary: Negative for dysuria.  Musculoskeletal: Positive for joint pain. Negative for falls.  Skin: Negative for rash.  Neurological: Negative for loss of consciousness and headaches.  Endo/Heme/Allergies: Negative for polydipsia.  Psychiatric/Behavioral: Negative for depression and suicidal ideas. The patient is not nervous/anxious and does not have insomnia.     Objective  BP 126/75 mmHg  Pulse 70  Temp(Src) 98.7 F (37.1 C) (Oral)  Ht 5' 7"  (1.702 m)  Wt 162 lb 8 oz (73.71 kg)  BMI 25.45 kg/m2  SpO2 100%  Physical Exam  Physical Exam  Constitutional: She is oriented to person, place, and time and well-developed, well-nourished, and in no distress. No distress.  HENT:   Head: Normocephalic and atraumatic.  Eyes: Conjunctivae are normal.  Neck: Neck supple. No thyromegaly present.  Cardiovascular: Normal rate, regular rhythm and normal heart sounds.   No murmur heard. Pulmonary/Chest: Effort normal and breath sounds normal. She has no wheezes.  Abdominal: She exhibits no distension and no mass.  Musculoskeletal: She exhibits no edema.  Lymphadenopathy:    She has no cervical adenopathy.  Neurological: She is alert and oriented to person, place, and time.  Skin: Skin is warm and dry. No rash noted. She is not diaphoretic.  Psychiatric: Memory, affect and judgment normal.    Lab Results  Component Value Date   TSH 0.94 07/22/2014   Lab Results  Component Value Date   WBC 4.7 07/22/2014   HGB 12.4 07/22/2014   HCT 37.7 07/22/2014   MCV 75.1* 07/22/2014   PLT 279.0 07/22/2014   Lab Results  Component Value Date   CREATININE 0.82 09/10/2014   BUN 21 09/10/2014   NA 139 09/10/2014   K 3.7 09/10/2014   CL 105 09/10/2014   CO2 30 09/10/2014   Lab Results  Component Value Date   ALT 23 09/10/2014   AST 29 09/10/2014   ALKPHOS 86 09/10/2014   BILITOT 0.4 09/10/2014   Lab Results  Component Value Date   CHOL 239* 07/22/2014   Lab Results  Component Value Date   HDL 76.40 07/22/2014   Lab Results  Component Value Date   LDLCALC 147* 07/22/2014   Lab Results  Component Value Date   TRIG 78.0 07/22/2014   Lab Results  Component Value Date   CHOLHDL 3 07/22/2014     Assessment & Plan  Essential hypertension Well controlled, no changes to meds. Encouraged heart healthy diet such as the DASH diet and exercise as tolerated.    Dyslipidemia Encouraged heart healthy diet, increase exercise, avoid trans fats, consider a krill oil cap daily   Impaired glucose tolerance hgba1c acceptable, minimize simple carbs. Increase exercise as tolerated. Continue current meds   Osteopenia Tolerates Boniva. Encouraged calcium and vitamin  D supplements   Peripheral edema Some leg pain is noted at ankles, is encouraged to elevate feet above heart several times a day. Minimize sodium and report if pain is worsening.

## 2014-09-15 ENCOUNTER — Ambulatory Visit: Payer: Medicare Other | Admitting: Family Medicine

## 2014-09-17 ENCOUNTER — Ambulatory Visit (AMBULATORY_SURGERY_CENTER): Payer: Self-pay | Admitting: *Deleted

## 2014-09-17 VITALS — Ht 67.0 in | Wt 161.6 lb

## 2014-09-17 DIAGNOSIS — Z86010 Personal history of colonic polyps: Secondary | ICD-10-CM

## 2014-09-17 MED ORDER — NA SULFATE-K SULFATE-MG SULF 17.5-3.13-1.6 GM/177ML PO SOLN: 1.0000 | Freq: Once | ORAL | Status: DC

## 2014-09-17 NOTE — Progress Notes (Signed)
No egg or soy allergy Dairy causes increased gas and stomach pains and occ diarrhea No home 02 No diet pills No issues with past sedation

## 2014-09-18 ENCOUNTER — Encounter: Payer: Self-pay | Admitting: Gastroenterology

## 2014-09-22 ENCOUNTER — Encounter: Payer: Self-pay | Admitting: Family Medicine

## 2014-09-23 ENCOUNTER — Telehealth: Payer: Self-pay | Admitting: Family Medicine

## 2014-09-23 NOTE — Telephone Encounter (Signed)
Relation to pt: self  Call back number: [phone]   Reason for call:  As per pt medicare [employer] requesting sleep study DX code  [phone]

## 2014-09-24 NOTE — Telephone Encounter (Signed)
Not sure who wants these codes but I ordered the sleep study on order for 4 reasons. Hypertension I10, restless sleeper 780.50, insomnia G47.00, snoring 786.09.

## 2014-09-24 NOTE — Telephone Encounter (Signed)
When called to insurance was told Prior Josem Kaufmann was not required, so I'm not sure.

## 2014-09-25 NOTE — Telephone Encounter (Signed)
Called [employer] confirmed PA not required.

## 2014-09-26 ENCOUNTER — Ambulatory Visit (HOSPITAL_BASED_OUTPATIENT_CLINIC_OR_DEPARTMENT_OTHER): Payer: Medicare Other | Attending: Family Medicine

## 2014-09-26 VITALS — Ht 67.0 in | Wt 162.0 lb

## 2014-09-26 DIAGNOSIS — G4733 Obstructive sleep apnea (adult) (pediatric): Secondary | ICD-10-CM | POA: Diagnosis not present

## 2014-09-29 ENCOUNTER — Ambulatory Visit (AMBULATORY_SURGERY_CENTER): Payer: Medicare Other | Admitting: Gastroenterology

## 2014-09-29 ENCOUNTER — Encounter: Payer: Self-pay | Admitting: Gastroenterology

## 2014-09-29 ENCOUNTER — Telehealth: Payer: Self-pay | Admitting: Gastroenterology

## 2014-09-29 VITALS — BP 122/75 | HR 70 | Temp 97.7°F | Resp 22 | Ht 67.0 in | Wt 161.0 lb

## 2014-09-29 DIAGNOSIS — Z86010 Personal history of colonic polyps: Secondary | ICD-10-CM

## 2014-09-29 MED ORDER — SODIUM CHLORIDE 0.9 % IV SOLN: 500.0000 mL | INTRAVENOUS | Status: DC

## 2014-09-29 NOTE — Patient Instructions (Signed)
YOU HAD AN ENDOSCOPIC PROCEDURE TODAY AT [facility]:   Refer to the procedure report that was given to you for any specific questions about what was found during the examination.  If the procedure report does not answer your questions, please call your gastroenterologist to clarify.  If you requested that your care partner not be given the details of your procedure findings, then the procedure report has been included in a sealed envelope for you to review at your convenience later.  YOU SHOULD EXPECT: Some feelings of bloating in the abdomen. Passage of more gas than usual.  Walking can help get rid of the air that was put into your GI tract during the procedure and reduce the bloating. If you had a lower endoscopy (such as a colonoscopy or flexible sigmoidoscopy) you may notice spotting of blood in your stool or on the toilet paper. If you underwent a bowel prep for your procedure, you may not have a normal bowel movement for a few days.  Please Note:  You might notice some irritation and congestion in your nose or some drainage.  This is from the oxygen used during your procedure.  There is no need for concern and it should clear up in a day or so.  SYMPTOMS TO REPORT IMMEDIATELY:   Following lower endoscopy (colonoscopy or flexible sigmoidoscopy):  Excessive amounts of blood in the stool  Significant tenderness or worsening of abdominal pains  Swelling of the abdomen that is new, acute  Fever of 100F or higher   For urgent or emergent issues, a gastroenterologist can be reached at any hour by calling [phone].   DIET: Your first meal following the procedure should be a small meal and then it is ok to progress to your normal diet. Heavy or fried foods are harder to digest and may make you feel nauseous or bloated.  Likewise, meals heavy in dairy and vegetables can increase bloating.  Drink plenty of fluids but you should avoid alcoholic beverages for 24 hours. Try to  increase the fiber in your diet.  It will help to prevent Diverticulitis!  ACTIVITY:  You should plan to take it easy for the rest of today and you should NOT DRIVE or use heavy machinery until tomorrow (because of the sedation medicines used during the test).    FOLLOW UP: Our staff will call the number listed on your records the next business day following your procedure to check on you and address any questions or concerns that you may have regarding the information given to you following your procedure. If we do not reach you, we will leave a message.  However, if you are feeling well and you are not experiencing any problems, there is no need to return our call.  We will assume that you have returned to your regular daily activities without incident.  If any biopsies were taken you will be contacted by phone or by letter within the next 1-3 weeks.  Please call us at [phone] if you have not heard about the biopsies in 3 weeks.    SIGNATURES/CONFIDENTIALITY: You and/or your care partner have signed paperwork which will be entered into your electronic medical record.  These signatures attest to the fact that that the information above on your After Visit Summary has been reviewed and is understood.  Full responsibility of the confidentiality of this discharge information lies with you and/or your care-partner.  Read all of the handouts given to you by  your recovery room nurse.

## 2014-09-29 NOTE — Op Note (Signed)
[place]  [employer]. [place], [address]   COLONOSCOPY PROCEDURE REPORT  PATIENT: [name] [name], [name] [name]  MR#: [id number] BIRTHDATE: [birth year] , 69  yrs. old GENDER: female ENDOSCOPIST: Ladene Artist, MD, [facility] PROCEDURE DATE:  09/29/2014 PROCEDURE:   Colonoscopy, surveillance First Screening Colonoscopy - Avg.  risk and is 50 yrs.  old or older - No.  Prior Negative Screening - Now for repeat screening. N/A  History of Adenoma - Now for follow-up colonoscopy & has been > or = to 3 yrs.  N/A ASA CLASS:   Class II INDICATIONS:Surveillance due to prior colonic neoplasia and PH Colon Adenoma. MEDICATIONS: Monitored anesthesia care and Propofol 200 mg IV DESCRIPTION OF PROCEDURE:   After the risks benefits and alternatives of the procedure were thoroughly explained, informed consent was obtained.  The digital rectal exam revealed no abnormalities of the rectum.   The LB [id number]  endoscope was introduced through the anus and advanced to the cecum, which was identified by both the appendix and ileocecal valve. No adverse events experienced.   The quality of the prep was good.  (Suprep was used)  The instrument was then slowly withdrawn as the colon was fully examined.    COLON FINDINGS: There was moderate diverticulosis noted in the sigmoid colon and descending colon with muscular hypertrophy.   The examination was otherwise normal.  Retroflexed views revealed no abnormalities. The time to cecum = 7.3 Withdrawal time = 9.8   The scope was withdrawn and the procedure completed. COMPLICATIONS: There were no immediate complications.  ENDOSCOPIC IMPRESSION: 1.   Moderate diverticulosis in the sigmoid colon and descending colon 2.   The examination was otherwise normal  RECOMMENDATIONS: 1.  High fiber diet with liberal fluid intake. 2.  Repeat Colonoscopy in 5 years.  eSigned:  Ladene Artist, MD, [facility] 09/29/2014 1:56 PM

## 2014-09-29 NOTE — Progress Notes (Signed)
To recovery, report to Hodges, RN, VSS 

## 2014-09-29 NOTE — Telephone Encounter (Signed)
Returned phone call to patient. Patient took and tolerated prep last night. This morning she drank the prep and vomited, she believes the entire am dose of prep. Pt reports stools are brown liquid like diarrhea. Discussed with Dr. Fuller Plan. He requests patient to purchase and take 1 bottle of Magnesium Citrate as quickly as possible. Patient understands.

## 2014-09-30 ENCOUNTER — Telehealth: Payer: Self-pay | Admitting: *Deleted

## 2014-09-30 NOTE — Telephone Encounter (Signed)
  Follow up Call-  Call back number 09/29/2014  Post procedure Call Back phone  # [phone]  Permission to leave phone message Yes     Patient questions:  Do you have a fever, pain , or abdominal swelling? No. Pain Score  0 *  Have you tolerated food without any problems? Yes.    Have you been able to return to your normal activities? Yes.    Do you have any questions about your discharge instructions: Diet   No. Medications  No. Follow up visit  No.  Do you have questions or concerns about your Care? No.  Actions: * If pain score is 4 or above: No action needed, pain <4.

## 2014-10-01 DIAGNOSIS — G4733 Obstructive sleep apnea (adult) (pediatric): Secondary | ICD-10-CM | POA: Diagnosis not present

## 2014-10-01 NOTE — Sleep Study (Signed)
[place]   NAME: [name] [name]  DATE OF BIRTH: Jul 23, 1945  MEDICAL RECORD [id number]  LOCATION: [facility]   PHYSICIAN: Amayra Kiedrowski V.   DATE OF STUDY: 09/26/14   SLEEP STUDY TYPE: Nocturnal Polysomnogram   REFERRING PHYSICIAN: Rigoberto Noel, MD   INDICATION FOR STUDY:  69 year old with hypertension, excessive daytime fatigue, mild snoring and non-refreshing sleep. At the time of this study ,they weighed 162 pounds with a height of 5 ft 7 inches and the BMI of 25, neck size of 14 inches. Epworth sleepiness score was 3   This nocturnal polysomnogram was performed with a sleep technologist in attendance. EEG, EOG,EMG and respiratory parameters recorded. Sleep stages, arousals, limb movements and respiratory data was scored according to criteria laid out by the American Academy of sleep medicine.   SLEEP ARCHITECTURE: Lights out was at 2301 PM and lights on was at 506 AM. Total sleep time was 272 minutes with a sleep period time of 352 minutes and a sleep efficiency of 75 %. Sleep latency was 10 minutes with latency to REM sleep of 178 minutes and wake after sleep onset of 82 minutes. . Sleep stages as a percentage of total sleep time was N1 -3 %,N2- 85 % and REM sleep 12 % ( 33 minutes) . The longest period of REM sleep was around 2 AM.   AROUSAL DATA : There were 72  arousals with an arousal index of 16 events per hour. Most of these were spontaneous & 8 were associated with respiratory events  RESPIRATORY DATA: There were 1 obstructive apneas, 0 central apneas, 0 mixed apneas and 17 hypopneas with apnea -hypopnea index of 4 events per hour. There were 5 RERAs with an RDI of 5 events per hour. There was no relation to sleep stage or body position. Supine sleep was  noted  MOVEMENT/PARASOMNIA: There were 12 PLMS with a PLM index of 2.6 events per hour. The PLM arousal index was 1 per hour.  OXYGEN DATA: The lowest desaturation was 86 % during  REM sleep and the desaturation index was 0.6 per hour.   CARDIAC DATA: The low heart rate was 30 beats per minute. The high heart rate recorded was an artifact. No arrhythmias were noted   DISCUSSION -mild snoring was noted . She did not meet criteria for CPAP intervention. She was desensitized with nasal pillows  IMPRESSION :  1. No evidence of significant obstructive sleep apnea with hypopneas causing sleep fragmentation and mild oxygen desaturation.  2. No evidence of cardiac arrhythmias,periodic limb movements or behavioral disturbance during sleep.  3. Sleep efficiency was poor  RECOMMENDATION:  1. No treatment is required for this degree of sleep disordered breathing  2. Patient should be cautioned against driving when sleepy  3. They should be asked to avoid medications with sedative side effects    Rigoberto Noel MD Diplomate, American Board of Sleep Medicine    ELECTRONICALLY SIGNED ON: 10/01/2014  [facility]  PH: [phone] FX: [phone]  [place]

## 2014-10-10 ENCOUNTER — Telehealth: Payer: Self-pay | Admitting: Family Medicine

## 2014-10-10 NOTE — Telephone Encounter (Signed)
Please print copy of sleep study for review. I cannot seem to find results on the study but I will review once printed

## 2014-10-10 NOTE — Telephone Encounter (Signed)
Caller name: Kidada Ging Relationship to patient: self Can be reached: [phone]  Reason for call: Pt called in for results of sleep study. She asked for a phone call and for a copy to be mailed to her for her records.

## 2014-10-10 NOTE — Telephone Encounter (Signed)
Call pulmonology next week and see when they think I will have results

## 2014-10-10 NOTE — Telephone Encounter (Signed)
Called the patient informed report has been printed but not resulted by Pulmonary yet.  She is going out of town for several weeks and would prefer once resulted to be mailed to her home she does not wish for a phone call back.

## 2014-10-17 ENCOUNTER — Telehealth: Payer: Self-pay | Admitting: Family Medicine

## 2014-10-17 NOTE — Telephone Encounter (Signed)
Mailed the patient a copy of her sleep study report as well as Dr. Bari Mantis response to her results/instructions.

## 2014-10-17 NOTE — Telephone Encounter (Signed)
-----   Message from Mosie Lukes, MD sent at 10/16/2014 10:41 PM EDT ----- Sleep study does not show any significant sleep apnea. OK to mail her a copy of the report, it is int he notes section  ----- Message -----    From: Jamesetta Orleans, CMA    Sent: 10/14/2014   1:16 PM      To: Mosie Lukes, MD    ----- Message -----    From: Rigoberto Noel, MD    Sent: 10/14/2014  12:30 PM      To: Donell Sievert Ewing, CMA, Glean Hess, CMA  Study read- report in epic  RA ----- Message -----    From: Glean Hess, CMA    Sent: 10/14/2014  12:09 PM      To: Rigoberto Noel, MD  See message from Dr. Frederik Pear nurse below.... ----- Message -----    From: Jamesetta Orleans, CMA    Sent: 10/14/2014  11:02 AM      To: Glean Hess, CMA  Darlyn Chamber, Dr. [name] ordered this patient a sleep study.  The patient would like her results and looks like Dr. Elsworth Soho is the interpreting MD.  Once he has interpreted  the sleep study please let me know as the patient wants a copy mailed to her. Thanks for your help. Robin Ewing CMA for Dr. Macie Burows [facility]

## 2014-11-03 ENCOUNTER — Encounter: Payer: Self-pay | Admitting: Family Medicine

## 2014-11-10 ENCOUNTER — Ambulatory Visit (INDEPENDENT_AMBULATORY_CARE_PROVIDER_SITE_OTHER): Payer: Medicare Other | Admitting: Family

## 2014-11-10 ENCOUNTER — Encounter: Payer: Self-pay | Admitting: Family

## 2014-11-10 VITALS — BP 124/80 | HR 65 | Temp 98.2°F | Resp 16 | Ht 67.0 in | Wt 159.0 lb

## 2014-11-10 DIAGNOSIS — G629 Polyneuropathy, unspecified: Secondary | ICD-10-CM | POA: Diagnosis not present

## 2014-11-10 DIAGNOSIS — E1140 Type 2 diabetes mellitus with diabetic neuropathy, unspecified: Secondary | ICD-10-CM | POA: Diagnosis not present

## 2014-11-10 DIAGNOSIS — E1149 Type 2 diabetes mellitus with other diabetic neurological complication: Secondary | ICD-10-CM

## 2014-11-10 MED ORDER — GABAPENTIN 300 MG PO CAPS: ORAL_CAPSULE | ORAL | Status: DC

## 2014-11-10 NOTE — Progress Notes (Signed)
Pre visit review using our clinic review tool, if applicable. No additional management support is needed unless otherwise documented below in the visit note. 

## 2014-11-10 NOTE — Patient Instructions (Addendum)
Complete lab work prior to leaving.  Start gabapentin for peripheral neuropathy. Follow up as scheduled with Dr. [name].   Peripheral Neuropathy Peripheral neuropathy is a type of nerve damage. It affects nerves that carry signals between the spinal cord and other parts of the body. These are called peripheral nerves. With peripheral neuropathy, one nerve or a group of nerves may be damaged.  CAUSES  Many things can damage peripheral nerves. For some people with peripheral neuropathy, the cause is unknown. Some causes include:  Diabetes. This is the most common cause of peripheral neuropathy.  Injury to a nerve.  Pressure or stress on a nerve that lasts a long time.  Too little vitamin B. Alcoholism can lead to this.  Infections.  Autoimmune diseases, such as multiple sclerosis and systemic lupus erythematosus.  Inherited nerve diseases.  Some medicines, such as cancer drugs.  Toxic substances, such as lead and mercury.  Too little blood flowing to the legs.  Kidney disease.  Thyroid disease. SIGNS AND SYMPTOMS  Different people have different symptoms. The symptoms you have will depend on which of your nerves is damaged. Common symptoms include:  Loss of feeling (numbness) in the feet and hands.  Tingling in the feet and hands.  Pain that burns.  Very sensitive skin.  Weakness.  Not being able to move a part of the body (paralysis).  Muscle twitching.  Clumsiness or poor coordination.  Loss of balance.  Not being able to control your bladder.  Feeling dizzy.  Sexual problems. DIAGNOSIS  Peripheral neuropathy is a symptom, not a disease. Finding the cause of peripheral neuropathy can be hard. To figure that out, your health care provider will take a medical history and do a physical exam. A neurological exam will also be done. This involves checking things affected by your brain, spinal cord, and nerves (nervous system). For example, your health care  provider will check your reflexes, how you move, and what you can feel.  Other types of tests may also be ordered, such as:  Blood tests.  A test of the fluid in your spinal cord.  Imaging tests, such as CT scans or an MRI.  Electromyography (EMG). This test checks the nerves that control muscles.  Nerve conduction velocity tests. These tests check how fast messages pass through your nerves.  Nerve biopsy. A small piece of nerve is removed. It is then checked under a microscope. TREATMENT   Medicine is often used to treat peripheral neuropathy. Medicines may include:  Pain-relieving medicines. Prescription or over-the-counter medicine may be suggested.  Antiseizure medicine. This may be used for pain.  Antidepressants. These also may help ease pain from neuropathy.  Lidocaine. This is a numbing medicine. You might wear a patch or be given a shot.  Mexiletine. This medicine is typically used to help control irregular heart rhythms.  Surgery. Surgery may be needed to relieve pressure on a nerve or to destroy a nerve that is causing pain.  Physical therapy to help movement.  Assistive devices to help movement. HOME CARE INSTRUCTIONS   Only take over-the-counter or prescription medicines as directed by your health care provider. Follow the instructions carefully for any given medicines. Do not take any other medicines without first getting approval from your health care provider.  If you have diabetes, work closely with your health care provider to keep your blood sugar under control.  If you have numbness in your feet:  Check every day for signs of injury or infection. Watch  for redness, warmth, and swelling.  Wear padded socks and comfortable shoes. These help protect your feet.  Do not do things that put pressure on your damaged nerve.  Do not smoke. Smoking keeps blood from getting to damaged nerves.  Avoid or limit alcohol. Too much alcohol can cause a lack of B  vitamins. These vitamins are needed for healthy nerves.  Develop a good support system. Coping with peripheral neuropathy can be stressful. Talk to a mental health specialist or join a support group if you are struggling.  Follow up with your health care provider as directed. SEEK MEDICAL CARE IF:   You have new signs or symptoms of peripheral neuropathy.  You are struggling emotionally from dealing with peripheral neuropathy.  You have a fever. SEEK IMMEDIATE MEDICAL CARE IF:   You have an injury or infection that is not healing.  You feel very dizzy or begin vomiting.  You have chest pain.  You have trouble breathing. Document Released: 04/29/2002 Document Revised: 01/19/2011 Document Reviewed: 01/14/2013 [facility] Patient Information 2015 [place], [place]. This information is not intended to replace advice given to you by your health care provider. Make sure you discuss any questions you have with your health care provider.

## 2014-11-10 NOTE — Progress Notes (Signed)
Subjective:    Patient ID: [name] [name], female    DOB: 08/14/1945, 69 y.o.   MRN: [id number]  HPI  [name] [name] is a 69 yr old female who presents today with chief complaint of burning  In her feet. She reports that feet feel like they are on fire. She notes some discoloration right lower leg.  She reports that the pain starts at her knees and radiates to the tips of her toes.  She reports that she took aleve with minimal improvement in her symptoms. Used a wash cloth with witch hazel with minimal improvement in her symptoms.   Lab Results  Component Value Date   HGBA1C 6.5 09/10/2014      Review of Systems See HPI  Past Medical History  Diagnosis Date  . Sarcoidosis 01/26/2007    Annotation: in remission since 1992 Qualifier: Diagnosis of  By: Linda Hedges MD, Heinz Knuckles   . Breast cancer 09/14/2011    T1N0 left March 2001, treated with lumpectomy and 11 node axillary evaluation, ER/PR negative, HER2 3+. Adjuvant adriamycin/cytoxan X 4 cycles.   Marland Kitchen HYPERGLYCEMIA 10/18/2009    Qualifier: Diagnosis of  By: Linda Hedges MD, Heinz Knuckles   . Essential hypertension 05/26/2008    Chronic     . Dyslipidemia 10/18/2009    Mild     . Osteoporosis 01/26/2007    Chronic   BDS at [facility] Vit D, exercises Pt declined Boniva   . MITRAL VALVE PROLAPSE, HX OF 01/26/2007    Qualifier: Diagnosis of  By: Linda Hedges MD, [place], CHRONIC 08/15/2008    Qualifier: Diagnosis of  By: Linda Hedges MD, Heinz Knuckles   . 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 Osteopenia 01/26/2007    Chronic   BDS at [facility] Vit D, exercises Pt declined Boniva   . Overactive bladder 04/01/2014    Worse 2015   . Headache 07/23/2014    History   Social History  . Marital Status: Single    Spouse Name: N/A  . Number of Children: N/A  . Years of Education: N/A   Occupational History  . Not on file.   Social History Main Topics  . Smoking status: Never Smoker   . Smokeless tobacco: Never Used  . Alcohol Use: No  . Drug Use:  No  . Sexual Activity: Not on file   Other Topics Concern  . Not on file   Social History Narrative    Past Surgical History  Procedure Laterality Date  . Breast lumpectomy Left     no bp/stick left   . Tubal ligation    . Left ankle fracture surgery    . Wrist fracture surgery    . Colonoscopy  01-19-2006  . Polypectomy  01-19-2006    Family History  Problem Relation Age of Onset  . Colon cancer Neg Hx     No Known Allergies  Current Outpatient Prescriptions on File Prior to Visit  Medication Sig Dispense Refill  . BONIVA 150 MG tablet Take 1 tablet (150 mg total) by mouth every 30 (thirty) days. Take in the morning with a full glass of water, on an empty stomach, and do not take anything else by mouth or lie down for the next 30 min. 3 tablet 3  . Calcium-Vitamin D 600-125 MG-UNIT TABS Take by mouth.      . Glucosamine-Chondroitin-MSM 500-400-125 MG TABS Take by mouth daily. 2 tablet daily     .  mirtazapine (REMERON SOL-TAB) 15 MG disintegrating tablet Take 15 mg by mouth at bedtime.    . triamterene-hydrochlorothiazide (MAXZIDE-25) 37.5-25 MG per tablet 1/2 tab daily 30 tablet 6   No current facility-administered medications on file prior to visit.    BP 124/80 mmHg  Pulse 65  Temp(Src) 98.2 F (36.8 C) (Oral)  Resp 16  Ht 5' 7" (1.702 m)  Wt 159 lb (72.122 kg)  BMI 24.90 kg/m2  SpO2 98%       Objective:   Physical Exam  Constitutional: She is oriented to person, place, and time. She appears well-developed and well-nourished. No distress.  Neurological: She is alert and oriented to person, place, and time.  Psychiatric: She has a normal mood and affect. Her behavior is normal. Judgment and thought content normal.  Ext: see DM foot exam        Assessment & Plan:

## 2014-11-10 NOTE — Assessment & Plan Note (Signed)
Will give trial of gabapentin. Had recent b12 level which was normal. Trial of gabapentin.

## 2014-11-11 ENCOUNTER — Encounter: Payer: Self-pay | Admitting: Family

## 2014-11-11 LAB — FOLATE: Folate: 16.2 ng/mL (ref >5.9)

## 2014-11-17 ENCOUNTER — Other Ambulatory Visit: Payer: Self-pay

## 2014-12-01 ENCOUNTER — Telehealth: Payer: Self-pay | Admitting: Family Medicine

## 2014-12-01 NOTE — Telephone Encounter (Signed)
Spoke with patient who stated she is feeling very drowsy when she takes the Gabapentin 3 times daily and doesn't feel comfortable driving.  She states that when she drives she can feel her eyes want to close.  She has been skipping the morning dose and just taking it at noon and bedtime, and this is helping with the drowsiness during the day.  She states that her pain is much better though.  She would like recommendations on if she should continue this medication or if she should take it differently.    Please advise.

## 2014-12-01 NOTE — Telephone Encounter (Signed)
Try just taking the gabapentin at bedtime.  If she develops pain during the day on this regimen let me know.

## 2014-12-01 NOTE — Telephone Encounter (Signed)
Caller name: Lesia Relation to pt: self Call back number: [phone] Pharmacy:  Reason for call:   Patient states that gabapentin was prescribed by [facility]. She states that she has been dizzy and sleepy ever since starting the med

## 2014-12-02 NOTE — Telephone Encounter (Signed)
Notified pt and she voices understanding. 

## 2014-12-09 ENCOUNTER — Ambulatory Visit (INDEPENDENT_AMBULATORY_CARE_PROVIDER_SITE_OTHER): Payer: Medicare Other | Admitting: Family Medicine

## 2014-12-09 ENCOUNTER — Encounter: Payer: Self-pay | Admitting: Family Medicine

## 2014-12-09 VITALS — BP 112/68 | HR 79 | Temp 98.5°F | Ht 67.0 in | Wt 157.5 lb

## 2014-12-09 DIAGNOSIS — I10 Essential (primary) hypertension: Secondary | ICD-10-CM

## 2014-12-09 DIAGNOSIS — E1140 Type 2 diabetes mellitus with diabetic neuropathy, unspecified: Secondary | ICD-10-CM | POA: Diagnosis not present

## 2014-12-09 DIAGNOSIS — M8580 Other specified disorders of bone density and structure, unspecified site: Secondary | ICD-10-CM | POA: Diagnosis not present

## 2014-12-09 DIAGNOSIS — E785 Hyperlipidemia, unspecified: Secondary | ICD-10-CM | POA: Diagnosis not present

## 2014-12-09 DIAGNOSIS — R609 Edema, unspecified: Secondary | ICD-10-CM

## 2014-12-09 DIAGNOSIS — E1149 Type 2 diabetes mellitus with other diabetic neurological complication: Secondary | ICD-10-CM

## 2014-12-09 DIAGNOSIS — G4700 Insomnia, unspecified: Secondary | ICD-10-CM

## 2014-12-09 LAB — URINALYSIS
BILIRUBIN URINE: NEGATIVE
Ketones, ur: NEGATIVE
LEUKOCYTES UA: NEGATIVE
Nitrite: NEGATIVE
PH: 5.5 (ref 5.0–8.0)
SPECIFIC GRAVITY, URINE: 1.025 (ref 1.000–1.030)
TOTAL PROTEIN, URINE-UPE24: NEGATIVE
URINE GLUCOSE: NEGATIVE
Urobilinogen, UA: 0.2 (ref 0.0–1.0)

## 2014-12-09 LAB — CBC
HCT: 38.6 % (ref 36.0–46.0)
HEMOGLOBIN: 12.4 g/dL (ref 12.0–15.0)
MCHC: 32.2 g/dL (ref 30.0–36.0)
MCV: 76.2 fl — ABNORMAL LOW (ref 78.0–100.0)
Platelets: 294 10*3/uL (ref 150.0–400.0)
RBC: 5.06 Mil/uL (ref 3.87–5.11)
RDW: 15.2 % (ref 11.5–15.5)
WBC: 5.9 10*3/uL (ref 4.0–10.5)

## 2014-12-09 LAB — COMPREHENSIVE METABOLIC PANEL
ALBUMIN: 4.1 g/dL (ref 3.5–5.2)
ALK PHOS: 73 U/L (ref 39–117)
ALT: 22 U/L (ref 0–35)
AST: 28 U/L (ref 0–37)
BILIRUBIN TOTAL: 0.3 mg/dL (ref 0.2–1.2)
BUN: 28 mg/dL — ABNORMAL HIGH (ref 6–23)
CALCIUM: 9.7 mg/dL (ref 8.4–10.5)
CO2: 26 meq/L (ref 19–32)
Chloride: 102 mEq/L (ref 96–112)
Creatinine, Ser: 0.8 mg/dL (ref 0.40–1.20)
GFR: 91.12 mL/min (ref >60.00)
Glucose, Bld: 86 mg/dL (ref 70–99)
Potassium: 3.7 mEq/L (ref 3.5–5.1)
SODIUM: 136 meq/L (ref 135–145)
Total Protein: 7.5 g/dL (ref 6.0–8.3)

## 2014-12-09 LAB — LIPID PANEL
Cholesterol: 273 mg/dL — ABNORMAL HIGH (ref 0–200)
HDL: 81 mg/dL (ref >39.00)
LDL Cholesterol: 169 mg/dL — ABNORMAL HIGH (ref 0–99)
NonHDL: 192
Total CHOL/HDL Ratio: 3
Triglycerides: 113 mg/dL (ref 0.0–149.0)
VLDL: 22.6 mg/dL (ref 0.0–40.0)

## 2014-12-09 LAB — HEMOGLOBIN A1C: Hgb A1c MFr Bld: 6.2 % (ref 4.6–6.5)

## 2014-12-09 MED ORDER — GABAPENTIN 100 MG PO CAPS: ORAL_CAPSULE | ORAL | Status: DC

## 2014-12-09 NOTE — Patient Instructions (Signed)
Capsaicin cream to feet daily   Peripheral Neuropathy Peripheral neuropathy is a type of nerve damage. It affects nerves that carry signals between the spinal cord and other parts of the body. These are called peripheral nerves. With peripheral neuropathy, one nerve or a group of nerves may be damaged.  CAUSES  Many things can damage peripheral nerves. For some people with peripheral neuropathy, the cause is unknown. Some causes include:  Diabetes. This is the most common cause of peripheral neuropathy.  Injury to a nerve.  Pressure or stress on a nerve that lasts a long time.  Too little vitamin B. Alcoholism can lead to this.  Infections.  Autoimmune diseases, such as multiple sclerosis and systemic lupus erythematosus.  Inherited nerve diseases.  Some medicines, such as cancer drugs.  Toxic substances, such as lead and mercury.  Too little blood flowing to the legs.  Kidney disease.  Thyroid disease. SIGNS AND SYMPTOMS  Different people have different symptoms. The symptoms you have will depend on which of your nerves is damaged. Common symptoms include:  Loss of feeling (numbness) in the feet and hands.  Tingling in the feet and hands.  Pain that burns.  Very sensitive skin.  Weakness.  Not being able to move a part of the body (paralysis).  Muscle twitching.  Clumsiness or poor coordination.  Loss of balance.  Not being able to control your bladder.  Feeling dizzy.  Sexual problems. DIAGNOSIS  Peripheral neuropathy is a symptom, not a disease. Finding the cause of peripheral neuropathy can be hard. To figure that out, your health care provider will take a medical history and do a physical exam. A neurological exam will also be done. This involves checking things affected by your brain, spinal cord, and nerves (nervous system). For example, your health care provider will check your reflexes, how you move, and what you can feel.  Other types of tests may  also be ordered, such as:  Blood tests.  A test of the fluid in your spinal cord.  Imaging tests, such as CT scans or an MRI.  Electromyography (EMG). This test checks the nerves that control muscles.  Nerve conduction velocity tests. These tests check how fast messages pass through your nerves.  Nerve biopsy. A small piece of nerve is removed. It is then checked under a microscope. TREATMENT   Medicine is often used to treat peripheral neuropathy. Medicines may include:  Pain-relieving medicines. Prescription or over-the-counter medicine may be suggested.  Antiseizure medicine. This may be used for pain.  Antidepressants. These also may help ease pain from neuropathy.  Lidocaine. This is a numbing medicine. You might wear a patch or be given a shot.  Mexiletine. This medicine is typically used to help control irregular heart rhythms.  Surgery. Surgery may be needed to relieve pressure on a nerve or to destroy a nerve that is causing pain.  Physical therapy to help movement.  Assistive devices to help movement. HOME CARE INSTRUCTIONS   Only take over-the-counter or prescription medicines as directed by your health care provider. Follow the instructions carefully for any given medicines. Do not take any other medicines without first getting approval from your health care provider.  If you have diabetes, work closely with your health care provider to keep your blood sugar under control.  If you have numbness in your feet:  Check every day for signs of injury or infection. Watch for redness, warmth, and swelling.  Wear padded socks and comfortable shoes. These help  protect your feet.  Do not do things that put pressure on your damaged nerve.  Do not smoke. Smoking keeps blood from getting to damaged nerves.  Avoid or limit alcohol. Too much alcohol can cause a lack of B vitamins. These vitamins are needed for healthy nerves.  Develop a good support system. Coping with  peripheral neuropathy can be stressful. Talk to a mental health specialist or join a support group if you are struggling.  Follow up with your health care provider as directed. SEEK MEDICAL CARE IF:   You have new signs or symptoms of peripheral neuropathy.  You are struggling emotionally from dealing with peripheral neuropathy.  You have a fever. SEEK IMMEDIATE MEDICAL CARE IF:   You have an injury or infection that is not healing.  You feel very dizzy or begin vomiting.  You have chest pain.  You have trouble breathing. Document Released: 04/29/2002 Document Revised: 01/19/2011 Document Reviewed: 01/14/2013 [facility] Patient Information 2015 [place], [place]. This information is not intended to replace advice given to you by your health care provider. Make sure you discuss any questions you have with your health care provider.

## 2014-12-09 NOTE — Progress Notes (Signed)
[name] [name]  [id number] 1946/01/28 12/09/2014      Progress Note-Follow Up  Subjective  Chief Complaint  Chief Complaint  Patient presents with  . Follow-up    HPI  Patient is a 69 y.o. female in today for routine medical care. Patient is doing well. No recent illness. Does continue to struggle with burning in her feet did not feel the Gabapentin has helped her pain. No concerns noted with side effects. Denies CP/palp/SOB/HA/congestion/fevers/GI or GU c/o. Taking meds as prescribed  Past Medical History  Diagnosis Date  . Sarcoidosis 01/26/2007    Annotation: in remission since 1992 Qualifier: Diagnosis of  By: Linda Hedges MD, Heinz Knuckles   . Breast cancer 09/14/2011    T1N0 left March 2001, treated with lumpectomy and 11 node axillary evaluation, ER/PR negative, HER2 3+. Adjuvant adriamycin/cytoxan X 4 cycles.   Marland Kitchen HYPERGLYCEMIA 10/18/2009    Qualifier: Diagnosis of  By: Linda Hedges MD, Heinz Knuckles   . Essential hypertension 05/26/2008    Chronic     . Dyslipidemia 10/18/2009    Mild     . Osteoporosis 01/26/2007    Chronic   BDS at [facility] Vit D, exercises Pt declined Boniva   . MITRAL VALVE PROLAPSE, HX OF 01/26/2007    Qualifier: Diagnosis of  By: Linda Hedges MD, [place], CHRONIC 08/15/2008    Qualifier: Diagnosis of  By: Linda Hedges MD, Heinz Knuckles   . 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 Osteopenia 01/26/2007    Chronic   BDS at [facility] Vit D, exercises Pt declined Boniva   . Overactive bladder 04/01/2014    Worse 2015   . Headache 07/23/2014    Past Surgical History  Procedure Laterality Date  . Breast lumpectomy Left     no bp/stick left   . Tubal ligation    . Left ankle fracture surgery    . Wrist fracture surgery    . Colonoscopy  01-19-2006  . Polypectomy  01-19-2006    Family History  Problem Relation Age of Onset  . Colon cancer Neg Hx     History   Social History  . Marital Status: Single    Spouse Name: N/A  . Number of Children: N/A  . Years  of Education: N/A   Occupational History  . Not on file.   Social History Main Topics  . Smoking status: Never Smoker   . Smokeless tobacco: Never Used  . Alcohol Use: No  . Drug Use: No  . Sexual Activity: Not on file   Other Topics Concern  . Not on file   Social History Narrative    Current Outpatient Prescriptions on File Prior to Visit  Medication Sig Dispense Refill  . BONIVA 150 MG tablet Take 1 tablet (150 mg total) by mouth every 30 (thirty) days. Take in the morning with a full glass of water, on an empty stomach, and do not take anything else by mouth or lie down for the next 30 min. 3 tablet 3  . gabapentin (NEURONTIN) 300 MG capsule 329m once daily day 1, Day 2: 300 mg twice daily, Day 3: 300 mg 3 times daily- then continue 3x daily (Patient taking differently: 300 mg at bedtime. ) 90 capsule 3  . Glucosamine-Chondroitin-MSM 500-400-125 MG TABS Take by mouth daily. 2 tablet daily     . triamterene-hydrochlorothiazide (MAXZIDE-25) 37.5-25 MG per tablet 1/2 tab daily 30 tablet 6  . Calcium-Vitamin D  600-125 MG-UNIT TABS Take by mouth.       No current facility-administered medications on file prior to visit.    No Known Allergies  Review of Systems  Review of Systems  Constitutional: Negative for fever and malaise/fatigue.  HENT: Negative for congestion.   Eyes: Negative for discharge.  Respiratory: Negative for shortness of breath.   Cardiovascular: Negative for chest pain, palpitations and leg swelling.  Gastrointestinal: Negative for nausea, abdominal pain and diarrhea.  Genitourinary: Negative for dysuria.  Musculoskeletal: Negative for falls.  Skin: Negative for rash.  Neurological: Negative for loss of consciousness and headaches.  Endo/Heme/Allergies: Negative for polydipsia.  Psychiatric/Behavioral: Negative for depression and suicidal ideas. The patient is not nervous/anxious and does not have insomnia.     Objective  BP 112/68 mmHg  Pulse 79   Temp(Src) 98.5 F (36.9 C) (Oral)  Ht 5' 7"  (1.702 m)  Wt 157 lb 8 oz (71.442 kg)  BMI 24.66 kg/m2  SpO2 97%  Physical Exam  Physical Exam  Constitutional: She is oriented to person, place, and time and well-developed, well-nourished, and in no distress. No distress.  HENT:  Head: Normocephalic and atraumatic.  Eyes: Conjunctivae are normal.  Neck: Neck supple. No thyromegaly present.  Cardiovascular: Normal rate and regular rhythm.  Exam reveals no friction rub.   No murmur heard. Pulmonary/Chest: Effort normal and breath sounds normal. She has no wheezes.  Abdominal: She exhibits no distension and no mass.  Musculoskeletal: She exhibits no edema.  Lymphadenopathy:    She has no cervical adenopathy.  Neurological: She is alert and oriented to person, place, and time.  Skin: Skin is warm and dry. No rash noted. She is not diaphoretic.  Psychiatric: Memory, affect and judgment normal.    Lab Results  Component Value Date   TSH 0.94 07/22/2014   Lab Results  Component Value Date   WBC 4.7 07/22/2014   HGB 12.4 07/22/2014   HCT 37.7 07/22/2014   MCV 75.1* 07/22/2014   PLT 279.0 07/22/2014   Lab Results  Component Value Date   CREATININE 0.82 09/10/2014   BUN 21 09/10/2014   NA 139 09/10/2014   K 3.7 09/10/2014   CL 105 09/10/2014   CO2 30 09/10/2014   Lab Results  Component Value Date   ALT 23 09/10/2014   AST 29 09/10/2014   ALKPHOS 86 09/10/2014   BILITOT 0.4 09/10/2014   Lab Results  Component Value Date   CHOL 239* 07/22/2014   Lab Results  Component Value Date   HDL 76.40 07/22/2014   Lab Results  Component Value Date   LDLCALC 147* 07/22/2014   Lab Results  Component Value Date   TRIG 78.0 07/22/2014   Lab Results  Component Value Date   CHOLHDL 3 07/22/2014     Assessment & Plan  Dyslipidemia Encouraged heart healthy diet, increase exercise, avoid trans fats, consider a krill oil cap daily  Osteopenia Encouraged Vitamin D 2000 IU  daily and 3 servings of calcium daily, stay as active as tolerated  DM (diabetes mellitus), type 2 with neurological complications CBJS2G acceptable, minimize simple carbs. Increase exercise as tolerated. Continue current meds. With peripheral neuropathy, will retry Gabapentin and stay on it with goal of titrating up.   INSOMNIA, CHRONIC Encouraged good sleep hygiene such as dark, quiet room. No blue/green glowing lights such as computer screens in bedroom. No alcohol or stimulants in evening. Cut down on caffeine as able. Regular exercise is helpful but not just prior  to bed time. Try Melatonin prn  Essential hypertension Well controlled, no changes to meds. Encouraged heart healthy diet such as the DASH diet and exercise as tolerated.   Peripheral edema Minimize sodium, elevate feet, compression hose.

## 2014-12-09 NOTE — Progress Notes (Signed)
Pre visit review using our clinic review tool, if applicable. No additional management support is needed unless otherwise documented below in the visit note. 

## 2014-12-10 ENCOUNTER — Telehealth: Payer: Self-pay | Admitting: Family Medicine

## 2014-12-10 ENCOUNTER — Other Ambulatory Visit: Payer: Self-pay | Admitting: Family Medicine

## 2014-12-10 MED ORDER — ATORVASTATIN CALCIUM 10 MG PO TABS: 10.0000 mg | ORAL_TABLET | Freq: Every day | ORAL | Status: AC

## 2014-12-10 NOTE — Telephone Encounter (Signed)
Relation to pt: self  Call back number: [phone]   Reason for call:  Patient returning your call regarding urine results

## 2014-12-11 LAB — URINE CULTURE
Colony Count: NO GROWTH
Organism ID, Bacteria: NO GROWTH

## 2014-12-22 NOTE — Assessment & Plan Note (Signed)
Well controlled, no changes to meds. Encouraged heart healthy diet such as the DASH diet and exercise as tolerated.  °

## 2014-12-22 NOTE — Assessment & Plan Note (Signed)
Encouraged Vitamin D 2000 IU daily and 3 servings of calcium daily, stay as active as tolerated

## 2014-12-22 NOTE — Assessment & Plan Note (Signed)
Encouraged good sleep hygiene such as dark, quiet room. No blue/green glowing lights such as computer screens in bedroom. No alcohol or stimulants in evening. Cut down on caffeine as able. Regular exercise is helpful but not just prior to bed time. Try Melatonin prn

## 2014-12-22 NOTE — Assessment & Plan Note (Signed)
Encouraged heart healthy diet, increase exercise, avoid trans fats, consider a krill oil cap daily 

## 2014-12-22 NOTE — Assessment & Plan Note (Signed)
Minimize sodium, elevate feet, compression hose.

## 2014-12-22 NOTE — Assessment & Plan Note (Addendum)
hgba1c acceptable, minimize simple carbs. Increase exercise as tolerated. Continue current meds. With peripheral neuropathy, will retry Gabapentin and stay on it with goal of titrating up.

## 2014-12-25 ENCOUNTER — Encounter: Payer: Self-pay | Admitting: Family Medicine

## 2014-12-26 ENCOUNTER — Encounter: Payer: Self-pay | Admitting: Family Medicine

## 2015-02-02 LAB — HM DIABETES EYE EXAM

## 2015-02-17 ENCOUNTER — Encounter: Payer: Self-pay | Admitting: Family Medicine

## 2015-02-24 ENCOUNTER — Telehealth: Payer: Self-pay | Admitting: Family Medicine

## 2015-02-24 NOTE — Telephone Encounter (Signed)
Just seeing this now. Please look around for notes from Dr Maurie Boettcher office so you have them when you see her

## 2015-02-24 NOTE — Telephone Encounter (Signed)
Received call from [facility] with Dr. Maurie Boettcher office. Pt is having double vision due to a cranial nerve issue. They are wanting pt to see PCP and run labs and possible do MRI. Jeani Hawking is faxing information from [place] for [place] to review. Pt is scheduled 02/25/15 2:30pm (30 min) with Einar Pheasant as Dr. [name] next available is 10/11. Fax being sent now to [phone] attn Robin/Sharon.

## 2015-02-25 ENCOUNTER — Ambulatory Visit (INDEPENDENT_AMBULATORY_CARE_PROVIDER_SITE_OTHER): Payer: Medicare Other | Admitting: Physician Assistant

## 2015-02-25 ENCOUNTER — Encounter: Payer: Self-pay | Admitting: Physician Assistant

## 2015-02-25 VITALS — BP 120/65 | HR 74 | Temp 98.4°F | Resp 16 | Ht 67.0 in | Wt 164.4 lb

## 2015-02-25 DIAGNOSIS — H532 Diplopia: Secondary | ICD-10-CM | POA: Diagnosis not present

## 2015-02-25 NOTE — Telephone Encounter (Signed)
thanks

## 2015-02-25 NOTE — Patient Instructions (Signed)
Please go to the lab for blood work. Then stop by the front desk to speak with [name] [name] about scheduling CT scan. I will call you with all results. No driving until assessment is complete.   Please follow the meal planning guide I have given you. You will be contacted by Nutrition for assessment.  Follow-up with Dermatology regarding. They need to do a punch biopsy. Sarna lotion will help with itch.

## 2015-02-25 NOTE — Progress Notes (Signed)
Patient presents to clinic today at request of Dr. Delman Cheadle, her Ophthalmologist for further assessment of CN VI palsy. Patient with mild headaches starting last Friday, lasting through weekend. Headache resolved mainly, but patient now with diplopia with deviation of left eye with medial movement. Patient denies eye pain. Denies nausea or vomiting. Denies fever, chills or malaise.  Past Medical History  Diagnosis Date  . Sarcoidosis ([place]) 01/26/2007    Annotation: in remission since 1992 Qualifier: Diagnosis of  By: Linda Hedges MD, Heinz Knuckles   . Breast cancer [facility]) 09/14/2011    T1N0 left March 2001, treated with lumpectomy and 11 node axillary evaluation, ER/PR negative, HER2 3+. Adjuvant adriamycin/cytoxan X 4 cycles.   Marland Kitchen HYPERGLYCEMIA 10/18/2009    Qualifier: Diagnosis of  By: Linda Hedges MD, Heinz Knuckles   . Essential hypertension 05/26/2008    Chronic     . Dyslipidemia 10/18/2009    Mild     . Osteoporosis 01/26/2007    Chronic   BDS at [facility] Vit D, exercises Pt declined Boniva   . MITRAL VALVE PROLAPSE, HX OF 01/26/2007    Qualifier: Diagnosis of  By: Linda Hedges MD, [place], CHRONIC 08/15/2008    Qualifier: Diagnosis of  By: Linda Hedges MD, Heinz Knuckles   . 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 Osteopenia 01/26/2007    Chronic   BDS at [facility] Vit D, exercises Pt declined Boniva   . Overactive bladder 04/01/2014    Worse 2015   . Headache 07/23/2014    Current Outpatient Prescriptions on File Prior to Visit  Medication Sig Dispense Refill  . atorvastatin (LIPITOR) 10 MG tablet Take 1 tablet (10 mg total) by mouth daily. 30 tablet 3  . BONIVA 150 MG tablet Take 1 tablet (150 mg total) by mouth every 30 (thirty) days. Take in the morning with a full glass of water, on an empty stomach, and do not take anything else by mouth or lie down for the next 30 min. 3 tablet 3  . Calcium-Vitamin D 600-125 MG-UNIT TABS Take by mouth.      . Glucosamine-Chondroitin-MSM 500-400-125 MG TABS  Take by mouth daily. 2 tablet daily     . triamterene-hydrochlorothiazide (MAXZIDE-25) 37.5-25 MG per tablet 1/2 tab daily 30 tablet 6   No current facility-administered medications on file prior to visit.    No Known Allergies  Family History  Problem Relation Age of Onset  . Colon cancer Neg Hx     Social History   Social History  . Marital Status: Single    Spouse Name: N/A  . Number of Children: N/A  . Years of Education: N/A   Social History Main Topics  . Smoking status: Never Smoker   . Smokeless tobacco: Never Used  . Alcohol Use: No  . Drug Use: No  . Sexual Activity: Not Asked   Other Topics Concern  . None   Social History Narrative   Review of Systems - See HPI.  All other ROS are negative.  BP 120/65 mmHg  Pulse 74  Temp(Src) 98.4 F (36.9 C) (Oral)  Resp 16  Ht _0  (1.702 m)  Wt 164 lb 6 oz (74.56 kg)  BMI 25.74 kg/m2  SpO2 100%  Physical Exam  Constitutional: She is oriented to person, place, and time and well-developed, well-nourished, and in no distress.  HENT:  Head: Normocephalic and atraumatic.  Eyes: Right eye visual fields normal. Conjunctivae  and lids are normal. Pupils are equal, round, and reactive to light. Lids are everted and swept, no foreign bodies found. Left eye exhibits abnormal extraocular motion.  Cardiovascular: Normal rate, regular rhythm, normal heart sounds and intact distal pulses.   Pulmonary/Chest: Effort normal and breath sounds normal. No respiratory distress. She has no wheezes. She has no rales. She exhibits no tenderness.  Neurological: She is alert and oriented to person, place, and time.  Skin: Skin is warm and dry. No rash noted.  Vitals reviewed.   Recent Results (from the past 2160 hour(s))  Urinalysis     Status: Abnormal   Collection Time: 12/09/14  1:53 PM  Result Value Ref Range   Color, Urine YELLOW Yellow;Lt. Yellow   APPearance CLEAR Clear   Specific Gravity, Urine 1.025 1.000-1.030   pH 5.5  5.0 - 8.0   Total Protein, Urine NEGATIVE Negative   Urine Glucose NEGATIVE Negative   Ketones, ur NEGATIVE Negative   Bilirubin Urine NEGATIVE Negative   Hgb urine dipstick TRACE-INTACT (A) Negative   Urobilinogen, UA 0.2 0.0 - 1.0   Leukocytes, UA NEGATIVE Negative   Nitrite NEGATIVE Negative  Urine culture     Status: None   Collection Time: 12/09/14  1:53 PM  Result Value Ref Range   Colony Count NO GROWTH    Organism ID, Bacteria NO GROWTH   Lipid panel     Status: Abnormal   Collection Time: 12/09/14  1:53 PM  Result Value Ref Range   Cholesterol 273 (H) 0 - 200 mg/dL    Comment: ATP III Classification       Desirable:  < 200 mg/dL               Borderline High:  200 - 239 mg/dL          High:  > = 240 mg/dL   Triglycerides 113.0 0.0 - 149.0 mg/dL    Comment: Normal:  <150 mg/dLBorderline High:  150 - 199 mg/dL   HDL 81.00 >39.00 mg/dL   VLDL 22.6 0.0 - 40.0 mg/dL   LDL Cholesterol 169 (H) 0 - 99 mg/dL   Total CHOL/HDL Ratio 3     Comment:                Men          Women1/2 Average Risk     3.4          3.3Average Risk          5.0          4.42X Average Risk          9.6          7.13X Average Risk          15.0          11.0                       NonHDL 192.00     Comment: NOTE:  Non-HDL goal should be 30 mg/dL higher than patient's LDL goal (i.e. LDL goal of < 70 mg/dL, would have non-HDL goal of < 100 mg/dL)  Hemoglobin A1c     Status: None   Collection Time: 12/09/14  1:53 PM  Result Value Ref Range   Hgb A1c MFr Bld 6.2 4.6 - 6.5 %    Comment: Glycemic Control Guidelines for People with Diabetes:Non Diabetic:  <6%Goal of Therapy: <7%Additional Action Suggested:  >8%   Comprehensive metabolic  panel     Status: Abnormal   Collection Time: 12/09/14  1:53 PM  Result Value Ref Range   Sodium 136 135 - 145 mEq/L   Potassium 3.7 3.5 - 5.1 mEq/L   Chloride 102 96 - 112 mEq/L   CO2 26 19 - 32 mEq/L   Glucose, Bld 86 70 - 99 mg/dL   BUN 28 (H) 6 - 23 mg/dL   Creatinine,  Ser 0.80 0.40 - 1.20 mg/dL   Total Bilirubin 0.3 0.2 - 1.2 mg/dL   Alkaline Phosphatase 73 39 - 117 U/L   AST 28 0 - 37 U/L   ALT 22 0 - 35 U/L   Total Protein 7.5 6.0 - 8.3 g/dL   Albumin 4.1 3.5 - 5.2 g/dL   Calcium 9.7 8.4 - 10.5 mg/dL   GFR 91.12 >60.00 mL/min  CBC     Status: Abnormal   Collection Time: 12/09/14  1:53 PM  Result Value Ref Range   WBC 5.9 4.0 - 10.5 K/uL   RBC 5.06 3.87 - 5.11 Mil/uL   Platelets 294.0 150.0 - 400.0 K/uL   Hemoglobin 12.4 12.0 - 15.0 g/dL   HCT 38.6 36.0 - 46.0 %   MCV 76.2 (L) 78.0 - 100.0 fl   MCHC 32.2 30.0 - 36.0 g/dL   RDW 15.2 11.5 - 15.5 %  HM DIABETES EYE EXAM     Status: None   Collection Time: 02/02/15 12:00 AM  Result Value Ref Range   HM Diabetic Eye Exam No Retinopathy No Retinopathy    Comment: [facility]    Assessment/Plan: Diplopia W headaches. Will obtain CT scan head to r/o mass. Will obtain lab panel today. No driving. If labs/CT negative will proceed with MRI. Supportive measures discussed.

## 2015-02-25 NOTE — Telephone Encounter (Signed)
I have them and will take care of her

## 2015-02-25 NOTE — Assessment & Plan Note (Signed)
W headaches. Will obtain CT scan head to r/o mass. Will obtain lab panel today. No driving. If labs/CT negative will proceed with MRI. Supportive measures discussed.

## 2015-02-26 ENCOUNTER — Ambulatory Visit (HOSPITAL_BASED_OUTPATIENT_CLINIC_OR_DEPARTMENT_OTHER)
Admission: RE | Admit: 2015-02-26 | Discharge: 2015-02-26 | Disposition: A | Discharge status: Home / Self Care | Payer: Medicare Other | Source: Ambulatory Visit | Attending: Physician Assistant | Admitting: Physician Assistant

## 2015-02-26 DIAGNOSIS — H532 Diplopia: Secondary | ICD-10-CM | POA: Diagnosis not present

## 2015-02-26 DIAGNOSIS — G9389 Other specified disorders of brain: Secondary | ICD-10-CM | POA: Diagnosis not present

## 2015-02-26 DIAGNOSIS — G588 Other specified mononeuropathies: Secondary | ICD-10-CM | POA: Diagnosis not present

## 2015-02-26 LAB — CBC WITH DIFFERENTIAL/PLATELET
BASOS PCT: 0.7 % (ref 0.0–3.0)
Basophils Absolute: 0 10*3/uL (ref 0.0–0.1)
EOS PCT: 3.3 % (ref 0.0–5.0)
Eosinophils Absolute: 0.2 10*3/uL (ref 0.0–0.7)
HEMATOCRIT: 40.8 % (ref 36.0–46.0)
Hemoglobin: 13 g/dL (ref 12.0–15.0)
Lymphocytes Relative: 40.9 % (ref 12.0–46.0)
Lymphs Abs: 2 10*3/uL (ref 0.7–4.0)
MCHC: 32 g/dL (ref 30.0–36.0)
MCV: 76.6 fl — ABNORMAL LOW (ref 78.0–100.0)
Monocytes Absolute: 0.4 10*3/uL (ref 0.1–1.0)
Monocytes Relative: 7.5 % (ref 3.0–12.0)
Neutro Abs: 2.3 10*3/uL (ref 1.4–7.7)
Neutrophils Relative %: 47.6 % (ref 43.0–77.0)
Platelets: 254 10*3/uL (ref 150.0–400.0)
RBC: 5.32 Mil/uL — ABNORMAL HIGH (ref 3.87–5.11)
RDW: 14.8 % (ref 11.5–15.5)
WBC: 4.9 10*3/uL (ref 4.0–10.5)

## 2015-02-26 LAB — BASIC METABOLIC PANEL
BUN: 25 mg/dL — AB (ref 6–23)
CO2: 25 mEq/L (ref 19–32)
Calcium: 9.9 mg/dL (ref 8.4–10.5)
Chloride: 103 mEq/L (ref 96–112)
Creatinine, Ser: 0.89 mg/dL (ref 0.40–1.20)
GFR: 80.52 mL/min (ref >60.00)
GLUCOSE: 92 mg/dL (ref 70–99)
POTASSIUM: 3.9 meq/L (ref 3.5–5.1)
Sodium: 139 mEq/L (ref 135–145)

## 2015-02-26 LAB — VITAMIN B12: Vitamin B-12: 619 pg/mL (ref 211–911)

## 2015-02-26 LAB — TSH: TSH: 0.88 u[IU]/mL (ref 0.35–4.50)

## 2015-02-26 MED ORDER — IOHEXOL 300 MG/ML  SOLN
75.0000 mL | Freq: Once | INTRAMUSCULAR | Status: AC | PRN
  Administered 2015-02-26: 75 mL via INTRAVENOUS

## 2015-03-02 ENCOUNTER — Telehealth: Payer: Self-pay | Admitting: Family Medicine

## 2015-03-02 NOTE — Telephone Encounter (Signed)
Called the patient informed of results of CT Scan (the patients daughter was also on the phone while getting results explained).  The patient would like to proceed with an MRI please as soon as possible.  The patient is continuing to have headaches and would like a personal phone call from Dr. [name] to discuss. That request for the phone call was from the patient, but the daughter verbally requested it.

## 2015-03-02 NOTE — Telephone Encounter (Signed)
Caller name: Desta Bujak   Relationship to patient: Self   Can be reached: [phone]  Pharmacy:  Reason for call: Pt says that she called in on Saturday. She says that she had a CT scan. Her headache isn't getting better and her vision isn't improving.  She want her daughter to listen in on the callback for results provided from provider.

## 2015-03-02 NOTE — Telephone Encounter (Signed)
Reviewed CT scan only meningioma noted not thought to be causing symptoms MRI can be ordered to try and find the cause of visual changes and HA and/or can refer to neurology urgently for further consideration. As of now am unable to tell her what is causing her symptoms. She saw an eye doctor. Confirm who she saw and see if we can get copies of the note if we do not have them

## 2015-03-03 ENCOUNTER — Other Ambulatory Visit: Payer: Self-pay | Admitting: Family Medicine

## 2015-03-03 DIAGNOSIS — R51 Headache: Principal | ICD-10-CM

## 2015-03-03 DIAGNOSIS — D329 Benign neoplasm of meninges, unspecified: Secondary | ICD-10-CM

## 2015-03-03 DIAGNOSIS — R519 Headache, unspecified: Secondary | ICD-10-CM

## 2015-03-03 NOTE — Telephone Encounter (Signed)
Patient was informed by the scheduler MRI ordered.

## 2015-03-03 NOTE — Telephone Encounter (Signed)
Per pt her headache is getting worse and her vision has not improved. She said that we do not understand the severity of the situation. She is very upset that we've not already ordered the MRI. Pt states this is an emergency and we do not seem to understand. She wanted me to convey to Dr. [name] that she is very angry. Pt then hung up.

## 2015-03-03 NOTE — Telephone Encounter (Signed)
Ordered MRI brain to evaluate Headache and Mengioma

## 2015-03-04 ENCOUNTER — Other Ambulatory Visit: Payer: Self-pay | Admitting: Family Medicine

## 2015-03-04 ENCOUNTER — Ambulatory Visit (HOSPITAL_COMMUNITY)
Admission: RE | Admit: 2015-03-04 | Discharge: 2015-03-04 | Disposition: A | Discharge status: Home / Self Care | Payer: Medicare Other | Source: Ambulatory Visit | Attending: Family Medicine | Admitting: Family Medicine

## 2015-03-04 DIAGNOSIS — R519 Headache, unspecified: Secondary | ICD-10-CM

## 2015-03-04 DIAGNOSIS — R51 Headache: Principal | ICD-10-CM

## 2015-03-04 DIAGNOSIS — D329 Benign neoplasm of meninges, unspecified: Secondary | ICD-10-CM

## 2015-03-04 NOTE — Telephone Encounter (Signed)
Nelle Don (son) & pt called in stating that part way thru the MRI it was stopped stating that it would not give them the results needed to help the pt. They are requesting a referral to Neuro urgently so they can find out what is the cause. They stated that neuro may be able to order the necessary testing once they determine the issue.  Best phone # to reach them [phone]

## 2015-03-04 NOTE — Telephone Encounter (Signed)
Received a phone call from imaging and spoke to Grandy.  She reported the wrong test was ordered.  Needs to be an MRI for Trigeminal with and without contrast. She also stated the patient was very claustrophobic and will need something called in for the next MRI. The manager at imaging is Henrietta Dine [phone] if you need to contact them.

## 2015-03-04 NOTE — Telephone Encounter (Signed)
I have placed an urgent referral to neurology which I had offered to place previously. I did not order the MRI because it sounds like they do not want that first. I will order later if they decide they want it prior to neurology referral

## 2015-03-04 NOTE — Telephone Encounter (Signed)
Called left message to call back 

## 2015-03-05 NOTE — Telephone Encounter (Signed)
Patient and son informed of neurology referral done as urgent.

## 2015-03-12 ENCOUNTER — Ambulatory Visit: Payer: Medicare Other | Admitting: Family Medicine

## 2015-03-13 ENCOUNTER — Ambulatory Visit (INDEPENDENT_AMBULATORY_CARE_PROVIDER_SITE_OTHER): Payer: Medicare Other | Admitting: Neurology

## 2015-03-13 ENCOUNTER — Encounter: Payer: Self-pay | Admitting: Neurology

## 2015-03-13 VITALS — BP 134/74 | HR 68 | Ht 67.0 in | Wt 163.4 lb

## 2015-03-13 DIAGNOSIS — I10 Essential (primary) hypertension: Secondary | ICD-10-CM | POA: Diagnosis not present

## 2015-03-13 DIAGNOSIS — R519 Headache, unspecified: Secondary | ICD-10-CM

## 2015-03-13 DIAGNOSIS — R51 Headache: Secondary | ICD-10-CM

## 2015-03-13 DIAGNOSIS — E785 Hyperlipidemia, unspecified: Secondary | ICD-10-CM | POA: Diagnosis not present

## 2015-03-13 DIAGNOSIS — H4921 Sixth [abducent] nerve palsy, right eye: Secondary | ICD-10-CM

## 2015-03-13 DIAGNOSIS — H4920 Sixth [abducent] nerve palsy, unspecified eye: Secondary | ICD-10-CM | POA: Insufficient documentation

## 2015-03-13 MED ORDER — DIAZEPAM 5 MG PO TABS: ORAL_TABLET | ORAL | Status: DC

## 2015-03-13 NOTE — Progress Notes (Addendum)
NEUROLOGY CONSULTATION NOTE  [name] MRN: [id number] DOB: [birth year]  Referring provider: Dr. Penni Homans Primary care provider: Dr. Penni Homans   Reason for consult:  Headaches, diplopia  Dear Dr [name]:  Thank you for your kind referral of [name] [name] for consultation of the above symptoms. Although her history is well known to you, please allow me to reiterate it for the purpose of our medical record. The patient was accompanied to the clinic by her son-in-law who also provides collateral information. Records and images were personally reviewed where available.  HISTORY OF PRESENT ILLNESS: This is a very pleasant 69 year old right-handed woman with a history of hypertension, insomnia, prediabetes, hyperlipidemia, presenting for evaluation of new onset headaches and diplopia. She was in her usual state of health until 02/20/15 when she woke up with a right frontal pressure headache. No associated nausea, vomiting, photo/phonophobia. She reports intensity is 3/10, until she takes Aleve and pain goes away until the evening, when she takes another Aleve then wakes up the next day again with a headache. She noticed that getting upset or crying would intensify the pain. On 02/23/15, she was driving when she had sudden onset horizontal diplopia. She denied any headache or eye pain at that time. She was able to drive home but the next day symptoms worsened and she was able to see her ophthalmologist who diagnosed her with a right 6th nerve palsy. She saw her PCP and had a head CT which I personally reviewed, no acute changes, there is a small calcified meningioma in the right middle cranial fossa without edema. She has had recent bloodwork with HbA1c of 6.2 and TSH 0.88. She reports that the diplopia has improved, however she continues to feel something is off with her vision, "like I have the wrong prescription." The headaches continue to occur consistently when she wakes up and at bedtime. She  reports pain is 0/10 at this time.   There is no family history of similar symptoms. Her sister had diabetes. She denies any prior similar symptoms. She denies any recent head injuries, infections, or travels. 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 She denies any dizziness, dysarthria, dysphagia, neck/back pain, focal weakness, bowel/bladder dysfunction. She has occasional tingling in her right hand in the morning. Around a year ago, she was diagnosed with peripheral neuropathy when she started having right foot tingling, burning, and warm sensation. She was given a prescription for gabapentin 396m qhs which made her very drowsy. The 1011mstill made her sleepy but not as bad as the higher dose. She does not take it anymore. She reports a bad reaction to mirtazapine that she took for insomnia, she was asleep "before I could say Amen," then woke up the next day with a very bad headache.   Laboratory Data: Lab Results  Component Value Date   WBC 4.9 02/25/2015   HGB 13.0 02/25/2015   HCT 40.8 02/25/2015   MCV 76.6* 02/25/2015   PLT 254.0 02/25/2015     Chemistry      Component Value Date/Time   NA 139 02/25/2015 1509   K 3.9 02/25/2015 1509   CL 103 02/25/2015 1509   CO2 25 02/25/2015 1509   BUN 25* 02/25/2015 1509   CREATININE 0.89 02/25/2015 1509      Component Value Date/Time   CALCIUM 9.9 02/25/2015 1509   ALKPHOS 73 12/09/2014 1353   AST 28  12/09/2014 1353   ALT 22 12/09/2014 1353   BILITOT 0.3 12/09/2014 1353     Lab Results  Component Value Date   TSH 0.88 02/25/2015   Lab Results  Component Value Date   [id number] 456 02/25/2015     PAST MEDICAL HISTORY: Past Medical History  Diagnosis Date  . Sarcoidosis ([place]) 01/26/2007    Annotation: in remission since 1992 Qualifier: Diagnosis of  By: Linda Hedges MD, Heinz Knuckles   . 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 Adjuvant adriamycin/cytoxan X 4 cycles.   Marland Kitchen HYPERGLYCEMIA 10/18/2009    Qualifier: Diagnosis of  By: Linda Hedges MD, Heinz Knuckles   . Essential hypertension 05/26/2008    Chronic     . Dyslipidemia 10/18/2009    Mild     . Osteoporosis 01/26/2007    Chronic   BDS at [facility] Vit D, exercises Pt declined Boniva   . MITRAL VALVE PROLAPSE, HX OF 01/26/2007    Qualifier: Diagnosis of  By: Linda Hedges MD, [place], CHRONIC 08/15/2008    Qualifier: Diagnosis of  By: Linda Hedges MD, Heinz Knuckles   . 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 Osteopenia 01/26/2007    Chronic   BDS at [facility] Vit D, exercises Pt declined Boniva   . Overactive bladder 04/01/2014    Worse 2015   . Headache 07/23/2014    PAST SURGICAL HISTORY: Past Surgical History  Procedure Laterality Date  . Breast lumpectomy Left     no bp/stick left   . Tubal ligation    . Left ankle fracture surgery    . Wrist fracture surgery    . Colonoscopy  01-19-2006  . Polypectomy  01-19-2006    MEDICATIONS: Current Outpatient Prescriptions on File Prior to Visit  Medication Sig Dispense Refill  . atorvastatin (LIPITOR) 10 MG tablet Take 1 tablet (10 mg total) by mouth daily. 30 tablet 3  . Biotin 5 MG CAPS Take 1 each by mouth daily.    Marland Kitchen BONIVA 150 MG tablet Take 1 tablet (150 mg total) by mouth every 30 (thirty) days. Take in the morning with a full glass of water, on an empty stomach, and do not take anything else by mouth or lie down for the next 30 min. 3 tablet 3  . Calcium-Vitamin D 600-125 MG-UNIT TABS Take by mouth.      . Glucosamine-Chondroitin-MSM 500-400-125 MG TABS Take by mouth daily. 2 tablet daily     . triamterene-hydrochlorothiazide (MAXZIDE-25) 37.5-25 MG per tablet 1/2 tab daily 30 tablet 6   No current facility-administered medications on file prior to visit.    ALLERGIES: No Known Allergies  FAMILY HISTORY: Family History  Problem Relation Age  of Onset  . Colon cancer Neg Hx     SOCIAL HISTORY: Social History   Social History  . Marital Status: Single    Spouse Name: N/A  . Number of Children: N/A  . Years of Education: N/A   Occupational History  . Not on file.   Social History Main Topics  . Smoking status: Never Smoker   . Smokeless tobacco: Never Used  . Alcohol Use: No  . Drug Use: No  . Sexual Activity: Not on file   Other Topics Concern  . Not on file   Social History Narrative   Pt lives alone in a two story  house. Pt navigates stair well, but  Is very cautions especially going down stairs.     REVIEW OF SYSTEMS: Constitutional: No fevers, chills, or sweats, no generalized fatigue, change in appetite Eyes: as above Ear, nose and throat: No hearing loss, ear pain, nasal congestion, sore throat Cardiovascular: No chest pain, palpitations Respiratory:  No shortness of breath at rest or with exertion, wheezes GastrointestinaI: No nausea, vomiting, diarrhea, abdominal pain, fecal incontinence Genitourinary:  No dysuria, urinary retention or frequency Musculoskeletal:  No neck pain, back pain Integumentary: No rash, pruritus, skin lesions Neurological: as above Psychiatric: No depression, insomnia, anxiety Endocrine: No palpitations, fatigue, diaphoresis, mood swings, change in appetite, change in weight, increased thirst Hematologic/Lymphatic:  No anemia, purpura, petechiae. Allergic/Immunologic: no itchy/runny eyes, nasal congestion, recent allergic reactions, rashes  PHYSICAL EXAM: Filed Vitals:   03/13/15 1302  BP: 134/74  Pulse: 68   General: No acute distress Head:  Normocephalic/atraumatic, no temporal artery tenderness or ropiness Eyes: Fundoscopic exam shows bilateral sharp discs, no vessel changes, exudates, or hemorrhages Neck: supple, no paraspinal tenderness, full range of motion Back: No paraspinal tenderness Heart: regular rate and rhythm Lungs: Clear to auscultation  bilaterally. Vascular: No carotid bruits. Skin/Extremities: No rash, no edema Neurological Exam: Mental status: alert and oriented to person, place, and time, no dysarthria or aphasia, Fund of knowledge is appropriate.  Recent and remote memory are intact.  Attention and concentration are normal.    Able to name objects and repeat phrases. Cranial nerves: CN I: not tested CN II: pupils equal, round and reactive to light, visual fields intact, fundi unremarkable. 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 No pain on eye movements, no nystagmus, no ptosis CN V: facial sensation intact CN VII: upper and lower face symmetric CN VIII: hearing intact to finger rub CN IX, X: gag intact, uvula midline CN XI: sternocleidomastoid and trapezius muscles intact CN XII: tongue midline Bulk & Tone: normal, no fasciculations. Motor: 5/5 throughout with no pronator drift. Sensation: intact to light touch, cold, pin, vibration and joint position sense.  No extinction to double simultaneous stimulation.  Romberg test negative Deep Tendon Reflexes: +1 throughout except for absent ankle jerks bilaterally, no ankle clonus Plantar responses: downgoing bilaterally Cerebellar: no incoordination on finger to nose testing Gait: narrow-based and steady, able to tandem walk adequately. Tremor: none  IMPRESSION: This is a very pleasant 69 year old right-handed woman with a vascular risk factors including hypertension, hyperlipidemia, prediabetes, presenting with new onset headaches and sudden onset horizontal diplopia on 02/23/15. 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 Etiology is unclear with broad list of potential causes, including ischemia, compressive lesion, inflammation, infection. In this age group, temporal arteritis should be ruled out. MRI brain with and  without contrast will be ordered to assess for ischemia or compressive lesion, MRA head will be ordered to rule out aneurysm. Bloodwork will be ordered for ESR, CRP, RPR, ACE level, Lyme Ab, Myasthenia panel. She did not tolerate previous MRI and will try taking Valium before open MRI. She will be prescribed a course of Prednisone for the headaches, duration will depend on ESR results. She will follow-up after tests.   Thank you for allowing me to participate in the care of this patient. Please do not hesitate to call for any questions or concerns.   Ellouise Newer,  M.D.  CC: Dr. [name]

## 2015-03-13 NOTE — Patient Instructions (Addendum)
1. Bloodwork for ESR, CRP, RPR, ACE level, Lyme Ab, Myasthenia panel 2. Schedule open MRI brain with and without contrast, MRA brain without contrast 3. Prior to MRI, take Valium 59m 1/2 tablet 30 minutes prior to test, if needed, may take another 1/2 tablet during the test 4. We will call in Prednisone prescription after we get results of bloodwork 5. Follow-up in 2 weeks  6. YOU HAVE BEEN SCHEDULED AT [facility] FOR AN MRI ON 03/19/15  PLEASE ARRIVE @ 11:45     [address]  [place] [address]      [phone]

## 2015-03-24 ENCOUNTER — Telehealth: Payer: Self-pay | Admitting: Family Medicine

## 2015-03-24 MED ORDER — PREDNISONE 20 MG PO TABS: ORAL_TABLET | ORAL | Status: DC

## 2015-03-24 NOTE — Telephone Encounter (Signed)
Patient was notified of results. Will send Rx of Prednisone to her pharmacy.

## 2015-03-24 NOTE — Telephone Encounter (Signed)
-----   Message from Cameron Sprang, MD sent at 03/24/2015  9:32 AM EDT ----- Regarding: RE: pls f/u Pls let her know I reviewed MRI brain and it looks good, no evidence of tumor, stroke, or aneurysm. Also same message: The bloodwork all came back normal, no evidence of inflammation infection, or muscle disorder. If still having a lot of headaches, pls call in one week course of Prednisone:  Prednisone 20mg : Take 3 tabs on day 1, 2-1/2 tabs on day 2, 2 tabs on day 3, 1-1/2 tabs on day 4, 1 tab on day 5, 1/2 tab on days 6 and 7, then stop. #11 tabs no refills  Thanks   ----- Message -----    From: Thurmon Fair, CMA    Sent: 03/24/2015   9:17 AM      To: Cameron Sprang, MD Subject: RE: pls f/u                                    MRI scheduled???? ----- Message -----    From: Cameron Sprang, MD    Sent: 03/24/2015   8:59 AM      To: Thurmon Fair, CMA Subject: pls f/u                                        Pls f/u on when her MRI is scheduled? The bloodwork all came back normal, no evidence of inflammation infection, or muscle disorder. If still having a lot of headaches, pls call in one week course of Prednisone:  Prednisone 20mg : Take 3 tabs on day 1, 2-1/2 tabs on day 2, 2 tabs on day 3, 1-1/2 tabs on day 4, 1 tab on day 5, 1/2 tab on days 6 and 7, then stop. #11 tabs no refills  Thanks

## 2015-03-30 ENCOUNTER — Encounter: Payer: Self-pay | Admitting: Neurology

## 2015-03-30 ENCOUNTER — Ambulatory Visit (INDEPENDENT_AMBULATORY_CARE_PROVIDER_SITE_OTHER): Payer: Medicare Other | Admitting: Neurology

## 2015-03-30 VITALS — BP 128/80 | HR 76 | Ht 67.0 in | Wt 163.0 lb

## 2015-03-30 DIAGNOSIS — H4921 Sixth [abducent] nerve palsy, right eye: Secondary | ICD-10-CM | POA: Diagnosis not present

## 2015-03-30 NOTE — Progress Notes (Signed)
NEUROLOGY FOLLOW UP OFFICE NOTE  [name] [name] [id number]  HISTORY OF PRESENT ILLNESS: I had the pleasure of seeing [name] [name] in follow-up in the neurology clinic on 03/30/2015.  The patient was last seen 2 weeks ago for 6th nerve palsy. Records and images were personally reviewed where available.  I personally reviewed MRI brain with and without contrast which did not show any compressive lesion/inflammation/aneurysm along the pathway of CN 6. No ischemia seen. There were scattered white matter changes seen consistent with chronic microvascular disease. Bloodwork was normal for ESR, CRP, RPR, ACE level, Lyme Ab, Myasthenia panel. She finished 8-day course of Prednisone today, and reports that headaches have significantly improved, she has not needed Tylenol for the past 4 days. She however has not noticed much improvement with her vision. She states the diplopia is better, but her vision still feels "off." She continues to wear an eye patch on the right. She denies any dizziness, focal numbness/tingling/weakness, no falls.   HPI: This is a very pleasant 69 yo RH woman with a history of hypertension, insomnia, prediabetes, hyperlipidemia, in her usual state of health until 02/20/15 when she woke up with a right frontal pressure headache. No associated nausea, vomiting, photo/phonophobia. She reports intensity is 3/10, until she takes Aleve and pain goes away until the evening, when she takes another Aleve then wakes up the next day again with a headache. She noticed that getting upset or crying would intensify the pain. On 02/23/15, she was driving when she had sudden onset horizontal diplopia. She denied any headache or eye pain at that time. She was able to drive home but the next day symptoms worsened and she was able to see her ophthalmologist who diagnosed her with a right 6th nerve palsy. She saw her PCP and had a head CT which I personally reviewed, no acute changes, there is a small calcified  meningioma in the right middle cranial fossa without edema. She has had recent bloodwork with HbA1c of 6.2 and TSH 0.88. She reports that the diplopia has improved, however she continues to feel something is off with her vision, "like I have the wrong prescription." The headaches continue to occur consistently when she wakes up and at bedtime. Around a year ago, she was diagnosed with peripheral neuropathy when she started having right foot tingling, burning, and warm sensation. She was given a prescription for gabapentin 323m qhs which made her very drowsy. The 1040mstill made her sleepy but not as bad as the higher dose. She does not take it anymore. She reports a bad reaction to mirtazapine that she took for insomnia, she was asleep "before I could say Amen," then woke up the next day with a very bad headache.   PAST MEDICAL HISTORY: Past Medical History  Diagnosis Date  . [facility]    Annotation: in remission since 1992 Qualifier: Diagnosis of  By: NoLinda HedgesD, MiHeinz [name] . 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 Adjuvant adriamycin/cytoxan X 4 cycles.   . Marland KitchenYPERGLYCEMIA 10/18/2009    Qualifier: Diagnosis of  By: NoLinda HedgesD, MiHeinz [name] . Essential hypertension 05/26/2008    Chronic     . Dyslipidemia 10/18/2009    Mild     . Osteoporosis 01/26/2007    [facility]   [facility] Bon[facility]   . MITRAL VALVE PROLAPSE,  HX OF 01/26/2007    Qualifier: Diagnosis of  By: [name] Hedges MD, [place], CHRONIC 08/15/2008    Qualifier: Diagnosis of  By: [name] Hedges MD, [name] [name]   . 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 Osteopenia 01/26/2007    Chronic   BDS at [facility] Vit D, exercises Pt declined Boniva   . Overactive bladder 04/01/2014    Worse 2015   . Headache 07/23/2014    MEDICATIONS: Current Outpatient Prescriptions on File Prior to Visit    Medication Sig Dispense Refill  . atorvastatin (LIPITOR) 10 MG tablet Take 1 tablet (10 mg total) by mouth daily. 30 tablet 3  . Biotin 5 MG CAPS Take 1 each by mouth daily.    Marland Kitchen BONIVA 150 MG tablet Take 1 tablet (150 mg total) by mouth every 30 (thirty) days. Take in the morning with a full glass of water, on an empty stomach, and do not take anything else by mouth or lie down for the next 30 min. 3 tablet 3  . Calcium-Vitamin D 600-125 MG-UNIT TABS Take by mouth.      . Glucosamine-Chondroitin-MSM 500-400-125 MG TABS Take by mouth daily. 2 tablet daily     . triamterene-hydrochlorothiazide (MAXZIDE-25) 37.5-25 MG per tablet 1/2 tab daily 30 tablet 6   No current facility-administered medications on file prior to visit.    ALLERGIES: No Known Allergies  FAMILY HISTORY: Family History  Problem Relation Age of Onset  . Colon cancer Neg Hx     SOCIAL HISTORY: Social History   Social History  . Marital Status: Single    Spouse Name: N/A  . Number of Children: N/A  . Years of Education: N/A   Occupational History  . Not on file.   Social History Main Topics  . Smoking status: Never Smoker   . Smokeless tobacco: Never Used  . Alcohol Use: No  . Drug Use: No  . Sexual Activity: Not on file   Other Topics Concern  . Not on file   Social History Narrative   Pt lives alone in a two story house. Pt navigates stair well, but  Is very cautions especially going down stairs.     REVIEW OF SYSTEMS: Constitutional: No fevers, chills, or sweats, no generalized fatigue, change in appetite Eyes: as above Ear, nose and throat: No hearing loss, ear pain, nasal congestion, sore throat Cardiovascular: No chest pain, palpitations Respiratory:  No shortness of breath at rest or with exertion, wheezes GastrointestinaI: No nausea, vomiting, diarrhea, abdominal pain, fecal incontinence Genitourinary:  No dysuria, urinary retention or frequency Musculoskeletal:  No neck pain, back  pain Integumentary: No rash, pruritus, skin lesions Neurological: as above Psychiatric: No depression, insomnia, anxiety Endocrine: No palpitations, fatigue, diaphoresis, mood swings, change in appetite, change in weight, increased thirst Hematologic/Lymphatic:  No anemia, purpura, petechiae. Allergic/Immunologic: no itchy/runny eyes, nasal congestion, recent allergic reactions, rashes  PHYSICAL EXAM: Filed Vitals:   03/30/15 1251  BP: 128/80  Pulse: 76   General: No acute distress Head:  Normocephalic/atraumatic Neck: supple, no paraspinal tenderness, full range of motion Heart:  Regular rate and rhythm Lungs:  Clear to auscultation bilaterally Back: No paraspinal tenderness Skin/Extremities: No rash, no edema Neurological Exam: alert and oriented to person, place, and time, no dysarthria or aphasia, Fund of knowledge is appropriate. Recent and remote memory are intact. Attention and concentration are normal. Able to name objects and repeat phrases. Cranial nerves: CN I:  not tested CN II: pupils equal, round and reactive to light, visual fields intact, fundi unremarkable. 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 No pain on eye movements, no nystagmus, no ptosis CN V: facial sensation intact CN VII: upper and lower face symmetric CN VIII: hearing intact to finger rub CN IX, X: gag intact, uvula midline CN XI: sternocleidomastoid and trapezius muscles intact CN XII: tongue midline Bulk & Tone: normal, no fasciculations. Motor: 5/5 throughout with no pronator drift. Sensation: intact to light touch. No extinction to double simultaneous stimulation. Romberg test negative Deep Tendon Reflexes: +1 throughout except for absent ankle jerks bilaterally Plantar responses: downgoing bilaterally Cerebellar: no incoordination on finger to nose testing Gait:  narrow-based and steady, able to tandem walk adequately. Tremor: none  IMPRESSION: This is a very pleasant 69 yo RH woman with a vascular risk factors including hypertension, hyperlipidemia, prediabetes, who presented with new onset headaches and sudden onset horizontal diplopia that started on 02/23/15. 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 MRI brain does not show any abnormalities, bloodwork normal, including myasthenia panel. 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 Headaches have resolved after course of Prednisone. Continue using eye patch. We discussed prognosis, typically it may take weeks to months for improvement, they are interested in options for rehab, and will be referred for neuro-optometric rehab at the [facility]. She will follow-up in 3 months and knows to call our office if symptoms recur or change.   Thank you for allowing me to participate in her care.  Please do not hesitate to call for any questions or concerns.  The duration of this appointment visit was 24 minutes of face-to-face time with the patient.  Greater than 50% of this time was spent in counseling, explanation of diagnosis, planning of further management, and coordination of care.   Ellouise Newer, M.D.   CC: Dr. [name]

## 2015-03-30 NOTE — Patient Instructions (Signed)
1. Use eye patch during daytime 2. Continue control of blood pressure, cholesterol, and prediabetes 3. Follow-up in 3 months, call for any problems

## 2015-04-07 ENCOUNTER — Telehealth: Payer: Self-pay | Admitting: Family Medicine

## 2015-04-07 NOTE — Telephone Encounter (Signed)
Noted, thanks!

## 2015-04-07 NOTE — Telephone Encounter (Signed)
FYI: I spoke with her. She doesn't want to proceed with referral for therapy at this time. States that right now [place] is too far for her to travel.        They were interested in possible rehab for her paralyzed eye muscle. I told them I would look up the name of the place, it is actually in [place], called the [facility]. Pls let them know we will send a referral for Neuro-optometric rehab for double vision due to right 6th nerve palsy, and see if they can do this for her.

## 2015-06-02 ENCOUNTER — Ambulatory Visit: Payer: Self-pay | Admitting: Obstetrics

## 2015-06-09 ENCOUNTER — Encounter: Payer: Self-pay | Admitting: Obstetrics

## 2015-06-09 ENCOUNTER — Ambulatory Visit (INDEPENDENT_AMBULATORY_CARE_PROVIDER_SITE_OTHER): Payer: Medicare Other | Admitting: Obstetrics

## 2015-06-09 ENCOUNTER — Encounter: Payer: Self-pay | Admitting: Oncology

## 2015-06-09 VITALS — BP 134/83 | HR 87 | Wt 165.0 lb

## 2015-06-09 DIAGNOSIS — N3281 Overactive bladder: Secondary | ICD-10-CM

## 2015-06-09 DIAGNOSIS — R350 Frequency of micturition: Secondary | ICD-10-CM | POA: Diagnosis not present

## 2015-06-09 LAB — POCT URINALYSIS DIPSTICK
BILIRUBIN UA: NEGATIVE
GLUCOSE UA: NEGATIVE
Ketones, UA: NEGATIVE
NITRITE UA: NEGATIVE
Protein, UA: NEGATIVE
Spec Grav, UA: <=1.005
Urobilinogen, UA: NEGATIVE
pH, UA: 5

## 2015-06-09 NOTE — Progress Notes (Addendum)
Patient ID: [name] [name], female   DOB: [birth year], 70 y.o.   MRN: [id number]  Chief Complaint  Patient presents with  . New Patient (Initial Visit)    HPI [name] [name] is a 70 y.o. female.  Complains of going to the bathroom all the time.  Feels mass in vagina like something is falling out.  Denies pelvic pain or urinary incontinence.  HPI  Past Medical History  Diagnosis Date  . Sarcoidosis ([place]) 01/26/2007    Annotation: in remission since 1992 Qualifier: Diagnosis of  By: Linda Hedges MD, Heinz Knuckles   . Breast cancer [facility]) 09/14/2011    T1N0 left March 2001, treated with lumpectomy and 11 node axillary evaluation, ER/PR negative, HER2 3+. Adjuvant adriamycin/cytoxan X 4 cycles.   Marland Kitchen HYPERGLYCEMIA 10/18/2009    Qualifier: Diagnosis of  By: Linda Hedges MD, Heinz Knuckles   . Essential hypertension 05/26/2008    Chronic     . Dyslipidemia 10/18/2009    Mild     . Osteoporosis 01/26/2007    Chronic   BDS at [facility] Vit D, exercises Pt declined Boniva   . MITRAL VALVE PROLAPSE, HX OF 01/26/2007    Qualifier: Diagnosis of  By: Linda Hedges MD, [place], CHRONIC 08/15/2008    Qualifier: Diagnosis of  By: Linda Hedges MD, Heinz Knuckles   . 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 Osteopenia 01/26/2007    Chronic   BDS at [facility] Vit D, exercises Pt declined Boniva   . Overactive bladder 04/01/2014    Worse 2015   . Headache 07/23/2014  . Neuropathy, peripheral [facility])     Past Surgical History  Procedure Laterality Date  . Breast lumpectomy Left     no bp/stick left   . Tubal ligation    . Left ankle fracture surgery    . Wrist fracture surgery    . Colonoscopy  01-19-2006  . Polypectomy  01-19-2006    Family History  Problem Relation Age of Onset  . Colon cancer Neg Hx     Social History Social History  Substance Use Topics  . Smoking status: Never Smoker   . Smokeless tobacco: Never Used  . Alcohol Use: No    No Known Allergies  Current Outpatient Prescriptions  Medication Sig  Dispense Refill  . aspirin EC 81 MG tablet Take 81 mg by mouth daily.    Marland Kitchen atorvastatin (LIPITOR) 10 MG tablet Take 1 tablet (10 mg total) by mouth daily. 30 tablet 3  . Biotin 5 MG CAPS Take 1 each by mouth daily.    Marland Kitchen BONIVA 150 MG tablet Take 1 tablet (150 mg total) by mouth every 30 (thirty) days. Take in the morning with a full glass of water, on an empty stomach, and do not take anything else by mouth or lie down for the next 30 min. 3 tablet 3  . Calcium-Vitamin D 600-125 MG-UNIT TABS Take by mouth.      . Glucosamine-Chondroitin-MSM 500-400-125 MG TABS Take by mouth daily. 2 tablet daily     . triamterene-hydrochlorothiazide (MAXZIDE-25) 37.5-25 MG per tablet 1/2 tab daily 30 tablet 6   No current facility-administered medications for this visit.    Review of Systems Review of Systems Constitutional: negative for fatigue and weight loss Respiratory: negative for cough and wheezing Cardiovascular: negative for chest pain, fatigue and palpitations Gastrointestinal: negative for abdominal pain and change in bowel habits Genitourinary: urinary frequency Integument/breast: negative for  nipple discharge Musculoskeletal:negative for myalgias Neurological: negative for gait problems and tremors Behavioral/Psych: negative for abusive relationship, depression Endocrine: negative for temperature intolerance     Blood pressure 134/83, pulse 87, weight 165 lb (74.844 kg).  Physical Exam Physical Exam           General:  Alert and no distress Abdomen:  normal findings: no organomegaly, soft, non-tender and no hernia  Pelvis:  External genitalia: normal general appearance Urinary system: urethral meatus normal and bladder without fullness, nontender.  Bladder base well supported. Vaginal: normal without tenderness, induration or masses Cervix: normal appearance.  Mild descent. Adnexa: normal bimanual exam Uterus: anteverted and non-tender, normal size      Data  Reviewed Labs Previous pap and mammogram  Assessment     Overactive Bladder Postmenopause.  Doing well.      Plan    Referred to Urogynecology for further evaluation. F/U prn  No orders of the defined types were placed in this encounter.   Meds ordered this encounter  Medications  . aspirin EC 81 MG tablet    Sig: Take 81 mg by mouth daily.

## 2015-06-10 ENCOUNTER — Telehealth: Payer: Self-pay

## 2015-06-10 NOTE — Telephone Encounter (Signed)
LET PATIENT KNOW OF HER APPT WITH DR Maryland Pink, MD ON 07/15/15 AT 10AM IN [place] OFFICE - THEY WILL MAIL HER A NEW PATIENT PACKET

## 2015-07-03 ENCOUNTER — Telehealth: Payer: Self-pay | Admitting: Certified Registered Nurse Anesthetist

## 2015-07-03 NOTE — Telephone Encounter (Signed)
Calling to discuss patient complaint. LVM.  Will attempt call back on Monday.

## 2015-07-06 ENCOUNTER — Ambulatory Visit: Payer: Medicare Other | Admitting: Neurology

## 2015-09-08 ENCOUNTER — Telehealth: Payer: Self-pay | Admitting: *Deleted

## 2015-09-08 NOTE — Telephone Encounter (Signed)
Patient's daughter is 70 years old and she was diagnosed with premature ovarian failure. Patient has not had a cycle since 2015. She is getting married in September and her mother wants her to see a specialist. She needs any doctors in [place] that you may know.

## 2015-09-09 ENCOUNTER — Encounter: Payer: Self-pay | Admitting: *Deleted

## 2015-09-09 NOTE — Telephone Encounter (Signed)
Message sent via MyChart.

## 2015-09-09 NOTE — Telephone Encounter (Signed)
Dr. Kerin Perna has office in [place].  Give patient his office phone number.

## 2015-10-14 ENCOUNTER — Telehealth: Payer: Self-pay

## 2015-10-14 NOTE — Telephone Encounter (Signed)
Patient is on the [place] List for 2017. Insurance has our MD; Gwyneth Revels listed as her PCP.   Faxed PAF back stating that pt is not seen in our office.

## 2015-12-08 ENCOUNTER — Ambulatory Visit: Payer: Medicare Other | Admitting: Obstetrics

## 2015-12-21 ENCOUNTER — Emergency Department (HOSPITAL_BASED_OUTPATIENT_CLINIC_OR_DEPARTMENT_OTHER): Payer: Medicare Other

## 2015-12-21 ENCOUNTER — Emergency Department (HOSPITAL_BASED_OUTPATIENT_CLINIC_OR_DEPARTMENT_OTHER)
Admission: EM | Admit: 2015-12-21 | Discharge: 2015-12-21 | Disposition: A | Discharge status: Home / Self Care | Payer: Medicare Other | Attending: Emergency Medicine | Admitting: Emergency Medicine

## 2015-12-21 ENCOUNTER — Encounter (HOSPITAL_BASED_OUTPATIENT_CLINIC_OR_DEPARTMENT_OTHER): Payer: Self-pay | Admitting: *Deleted

## 2015-12-21 DIAGNOSIS — Z7982 Long term (current) use of aspirin: Secondary | ICD-10-CM | POA: Insufficient documentation

## 2015-12-21 DIAGNOSIS — Z79899 Other long term (current) drug therapy: Secondary | ICD-10-CM | POA: Diagnosis not present

## 2015-12-21 DIAGNOSIS — M5442 Lumbago with sciatica, left side: Secondary | ICD-10-CM | POA: Insufficient documentation

## 2015-12-21 DIAGNOSIS — R319 Hematuria, unspecified: Secondary | ICD-10-CM

## 2015-12-21 DIAGNOSIS — Z853 Personal history of malignant neoplasm of breast: Secondary | ICD-10-CM | POA: Insufficient documentation

## 2015-12-21 DIAGNOSIS — I10 Essential (primary) hypertension: Secondary | ICD-10-CM | POA: Diagnosis not present

## 2015-12-21 DIAGNOSIS — M549 Dorsalgia, unspecified: Secondary | ICD-10-CM | POA: Diagnosis present

## 2015-12-21 DIAGNOSIS — M5432 Sciatica, left side: Secondary | ICD-10-CM

## 2015-12-21 LAB — URINE MICROSCOPIC-ADD ON

## 2015-12-21 LAB — URINALYSIS, ROUTINE W REFLEX MICROSCOPIC
Bilirubin Urine: NEGATIVE
Glucose, UA: NEGATIVE mg/dL
Ketones, ur: NEGATIVE mg/dL
NITRITE: NEGATIVE
PH: 5 (ref 5.0–8.0)
Protein, ur: NEGATIVE mg/dL
SPECIFIC GRAVITY, URINE: 1.016 (ref 1.005–1.030)

## 2015-12-21 NOTE — ED Notes (Signed)
MD at bedside. 

## 2015-12-21 NOTE — ED Notes (Signed)
Patient transported to CT 

## 2015-12-21 NOTE — ED Provider Notes (Signed)
[place] DEPT [facility] Provider Note   CSN: [id number] Arrival date & time: 12/21/15  1221  First Provider Contact:  First MD Initiated Contact with Patient 12/21/15 1234        History   Chief Complaint Chief Complaint  Patient presents with  . Back Pain    HPI [name] [name] is a 70 y.o. female.  The history is provided by the patient.  [name] [name] is a 70 y.o. female hx of HTN, peripheral neuropathy, breast cancer in remission, over active bladder here with back pain, L leg pain, dysuria. Patient states that she's been having chronic dysuria symptoms for several years. Occasionally gets better and occasionally got worse. She went to her primary care doctor on 7/13 and had a UA that showed possible UTI and was started on Cipro. Within 3 days, she's been having worse left sided back pain radiating down her leg and was thought to have radiculopathy. Cipro was stopped and she was put on amoxicillin but is still having radiculopathy. She actually saw urogynecology 3 days ago and had another UA that was unremarkable and had pelvic exam done that was unremarkable. She was fitted for pessary and was thought to have more spinal stenosis vs lumbar disc disease. Denies weakness, numbness. Has continued to have urinary symptoms similar to previous.     Past Medical History:  Diagnosis Date  . Breast cancer ([place]) 09/14/2011   T1N0 left March 2001, treated with lumpectomy and 11 node axillary evaluation, ER/PR negative, HER2 3+. Adjuvant adriamycin/cytoxan X 4 cycles.   . 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 Dyslipidemia 10/18/2009   Mild     . Essential hypertension 05/26/2008   Chronic     . Headache 07/23/2014  . HYPERGLYCEMIA 10/18/2009   Qualifier: Diagnosis of  By: Linda Hedges MD, [place], CHRONIC 08/15/2008   Qualifier: Diagnosis of  By: Linda Hedges MD, Heinz Knuckles   . MITRAL VALVE PROLAPSE, HX OF 01/26/2007   Qualifier: Diagnosis of  By: Linda Hedges MD, Heinz Knuckles   .  Neuropathy, peripheral ([place])   . Osteopenia 01/26/2007   Chronic   BDS at [facility] Vit D, exercises Pt declined Boniva   . Osteoporosis 01/26/2007   Chronic   BDS at [facility] Vit D, exercises Pt declined Boniva   . Overactive bladder 04/01/2014   Worse 2015   . Sarcoidosis ([place]) 01/26/2007   Annotation: in remission since 1992 Qualifier: Diagnosis of  By: Linda Hedges MD, Heinz Knuckles     Patient Active Problem List   Diagnosis Date Noted  . 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 Hyperlipemia 03/13/2015  . Diplopia 02/25/2015  . Headache 07/23/2014  . Peripheral edema 04/22/2014  . Chronic hypokalemia 04/01/2014  . Overactive bladder 04/01/2014  . Breast cancer ([place]) 09/14/2011  . Dyslipidemia 10/18/2009  . DM (diabetes mellitus), type 2 with neurological complications ([place]) 25/85/2778  . INSOMNIA, CHRONIC 08/15/2008  . Essential hypertension 05/26/2008  . Sarcoidosis ([place]) 01/26/2007  . Osteopenia 01/26/2007  . MITRAL VALVE PROLAPSE, HX OF 01/26/2007  . COLONIC POLYPS, HX OF 12/09/2006    Past Surgical History:  Procedure Laterality Date  . BREAST LUMPECTOMY Left    no bp/stick left   . COLONOSCOPY  01-19-2006  . left ankle fracture surgery    . POLYPECTOMY  01-19-2006  . TUBAL LIGATION    . WRIST FRACTURE SURGERY      OB History    No data available  Home Medications    Prior to Admission medications   Medication Sig Start Date End Date Taking? Authorizing Provider  aspirin EC 81 MG tablet Take 81 mg by mouth daily.   Yes Historical Provider, MD  atorvastatin (LIPITOR) 10 MG tablet Take 1 tablet (10 mg total) by mouth daily. 12/10/14  Yes Mosie Lukes, MD  Biotin 5 MG CAPS Take 1 each by mouth daily.   Yes Historical Provider, MD  Calcium-Vitamin D 600-125 MG-UNIT TABS Take by mouth.     Yes Historical Provider, MD  Glucosamine-Chondroitin-MSM [phone] MG TABS Take by mouth daily. 2 tablet daily    Yes Historical Provider, MD  nortriptyline (PAMELOR) 10 MG capsule Take 10 mg by  mouth at bedtime.   Yes Historical Provider, MD  risedronate (ACTONEL) 150 MG tablet Take 150 mg by mouth every 30 (thirty) days. with water on empty stomach, nothing by mouth or lie down for next 30 minutes.   Yes Historical Provider, MD  BONIVA 150 MG tablet Take 1 tablet (150 mg total) by mouth every 30 (thirty) days. Take in the morning with a full glass of water, on an empty stomach, and do not take anything else by mouth or lie down for the next 30 min. 08/12/14   Mosie Lukes, MD  triamterene-hydrochlorothiazide [facility]) 37.5-25 MG per tablet 1/2 tab daily 09/09/14   Mosie Lukes, MD    Family History Family History  Problem Relation Age of Onset  . Colon cancer Neg Hx     Social History Social History  Substance Use Topics  . Smoking status: Never Smoker  . Smokeless tobacco: Never Used  . Alcohol use No     Allergies   Ciprofloxacin and Gabapentin   Review of Systems Review of Systems  Musculoskeletal: Positive for back pain.  All other systems reviewed and are negative.    Physical Exam Updated Vital Signs BP 118/85   Pulse 90   Temp 98.2 F (36.8 C) (Oral)   Resp 18   Ht 5' 7"  (1.702 m)   Wt 150 lb (68 kg)   SpO2 100%   BMI 23.49 kg/m   Physical Exam  Constitutional: She is oriented to person, place, and time. She appears well-developed and well-nourished.  Anxious   HENT:  Head: Normocephalic.  Mouth/Throat: Oropharynx is clear and moist.  Eyes: EOM are normal. Pupils are equal, round, and reactive to light.  Neck: Normal range of motion. Neck supple.  Cardiovascular: Normal rate, regular rhythm and normal heart sounds.   Pulmonary/Chest: Effort normal and breath sounds normal. No respiratory distress. She has no wheezes. She has no rales.  Abdominal: Soft. Bowel sounds are normal.  Musculoskeletal: Normal range of motion.  Neurological: She is alert and oriented to person, place, and time.  CN 2-12 intact. Nl strength throughout. + straight  leg raise on L side. No saddle anesthesia.   Skin: Skin is warm.  Psychiatric: She has a normal mood and affect.  Nursing note and vitals reviewed.    ED Treatments / Results  Labs (all labs ordered are listed, but only abnormal results are displayed) Labs Reviewed  URINALYSIS, ROUTINE W REFLEX MICROSCOPIC (NOT AT [facility]) - Abnormal; Notable for the following:       Result Value   Hgb urine dipstick MODERATE (*)    Leukocytes, UA TRACE (*)    All other components within normal limits  URINE MICROSCOPIC-ADD ON - Abnormal; Notable for the following:    Squamous  Epithelial / LPF 0-5 (*)    Bacteria, UA FEW (*)    All other components within normal limits  URINE CULTURE    EKG  EKG Interpretation None       Radiology Ct Lumbar Spine Wo Contrast  Result Date: 12/21/2015 CLINICAL DATA:  Chronic back and left lower extremity pain. EXAM: CT LUMBAR SPINE WITHOUT CONTRAST TECHNIQUE: Multidetector CT imaging of the lumbar spine was performed without intravenous contrast administration. Multiplanar CT image reconstructions were also generated. COMPARISON:  Radiographs of December 19, 2015. FINDINGS: No fracture or spondylolisthesis is noted. Moderate degenerative disc disease is noted at L1-2, L2-3 and L5-S1. Degenerative disc disease is noted at L4-5 and L5-S1. Minimal broad-based posterior disc bulging is noted at L4-5 and L5-S1. No significant central spinal canal stenosis is noted. There does appear to be abnormal soft tissue density in the left-sided neural foramina of L4-5 resulting in significant stenosis. IMPRESSION: Moderate multilevel degenerative disc disease. Abnormal soft tissue density seen in the left-sided neural foramina of L4-5 resulting in significant neural foraminal stenosis ; this is concerning for disc bulging or herniation, and MRI is recommended for further evaluation. Electronically Signed   By: Marijo Conception, M.D.   On: 12/21/2015 13:15    Procedures Procedures  (including critical care time)  Medications Ordered in ED Medications - No data to display   Initial Impression / Assessment and Plan / ED Course  I have reviewed the triage vital signs and the nursing notes.  Pertinent labs & imaging results that were available during my care of the patient were reviewed by me and considered in my medical decision making (see chart for details).  Clinical Course    ZUZANNA MARONEY is a 70 y.o. female here presenting with left back pain, continual urinary symptoms. Has positive straight leg raise on the left side, neurovascular intact otherwise. She is convinced that her symptoms are side effects of cipro. Will get CT lumbar spine since recent xrays showed some spinal stenosis. She has multiple drug intolerances and states that she only tolerated alleve in the past. I hesitate to start her on meds given her history.   1:42 PM CT showed L4-5 stenosis vs disc bulging, likely contributing to her symptoms. Will refer her to neurosurgery for follow up. UA showed small amount of blood, which is chronic. No signs of UTI on UA. Will dc home.    Final Clinical Impressions(s) / ED Diagnoses   Final diagnoses:  None    New Prescriptions New Prescriptions   No medications on file     Drenda Freeze, MD 12/21/15 1343

## 2015-12-21 NOTE — ED Triage Notes (Signed)
Left flank pain. She was diagnosed with a UTI 7/13. She did not complete the antibiotics given.

## 2015-12-21 NOTE — Discharge Instructions (Signed)
Continue taking alleve.   See [place] neurosurgery for follow up. You likely have a pinched nerve from a disc in your back.   Avoid heavy lifting.   You have some blood in your urine and can follow up with your gynecologist outpatient.   Return to ER if you have worse leg pain, back pain, fever, vomiting, unable to urinate

## 2015-12-22 LAB — URINE CULTURE: Culture: 60000 — AB

## 2015-12-23 ENCOUNTER — Telehealth (HOSPITAL_BASED_OUTPATIENT_CLINIC_OR_DEPARTMENT_OTHER): Payer: Self-pay | Admitting: Emergency Medicine

## 2015-12-23 NOTE — Telephone Encounter (Signed)
Post ED Visit - Positive Culture Follow-up  Culture report reviewed by antimicrobial stewardship pharmacist:  []  Elenor Quinones, Pharm.D. []  Heide Guile, Pharm.D., BCPS []  Parks Neptune, Pharm.D. []  Alycia Rossetti, Pharm.D., BCPS []  Mears, Pharm.D., BCPS, AAHIVP []  Legrand Como, Pharm.D., BCPS, AAHIVP []  Milus Glazier, Pharm.D. []  Stephens November, Pharm.D Dimitri Ped PharmD  Positive urine culture Treated with none, no further patient follow-up is required at this time.  Hazle Nordmann 12/23/2015, 9:51 AM

## 2016-01-27 ENCOUNTER — Encounter: Payer: Self-pay | Admitting: Obstetrics

## 2016-01-27 ENCOUNTER — Ambulatory Visit (INDEPENDENT_AMBULATORY_CARE_PROVIDER_SITE_OTHER): Payer: Medicare Other | Admitting: Obstetrics

## 2016-01-27 DIAGNOSIS — N3281 Overactive bladder: Secondary | ICD-10-CM

## 2016-01-27 DIAGNOSIS — Z01419 Encounter for gynecological examination (general) (routine) without abnormal findings: Secondary | ICD-10-CM | POA: Diagnosis not present

## 2016-01-27 DIAGNOSIS — Z124 Encounter for screening for malignant neoplasm of cervix: Secondary | ICD-10-CM

## 2016-01-27 NOTE — Progress Notes (Signed)
Patient ID: [name] [name], female   DOB: 01-16-1946, 70 y.o.   MRN: [id number]   Subjective:        [name] [name] is a 70 y.o. female here for a routine exam.  Current complaints: None.    Personal health questionnaire:  Is patient Ashkenazi Jewish, have a family history of breast and/or ovarian cancer: yes Is there a family history of uterine cancer diagnosed at age < 48, gastrointestinal cancer, urinary tract cancer, family member who is a Field seismologist syndrome-associated carrier: no Is the patient overweight and hypertensive, family history of diabetes, personal history of gestational diabetes, preeclampsia or PCOS: no Is patient over 66, have PCOS,  family history of premature CHD under age 11, diabetes, smoke, have hypertension or peripheral artery disease:  no At any time, has a partner hit, kicked or otherwise hurt or frightened you?: no Over the past 2 weeks, have you felt down, depressed or hopeless?: no Over the past 2 weeks, have you felt little interest or pleasure in doing things?:no   Gynecologic History No LMP recorded. Patient is postmenopausal. Contraception: post menopausal status Last Pap: 2011. Results were: normal Last mammogram: 2017. Results were: normal  Obstetric History OB History  No data available    Past Medical History:  Diagnosis Date  . 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 Adjuvant adriamycin/cytoxan X 4 cycles.   . 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 Dyslipidemia 10/18/2009   Mild     . Essential hypertension 05/26/2008   Chronic     . Headache 07/23/2014  . HYPERGLYCEMIA 10/18/2009   Qualifier: Diagnosis of  By: Linda Hedges MD, [place], CHRONIC 08/15/2008   Qualifier: Diagnosis of  By: Linda Hedges MD, Heinz Knuckles   . MITRAL VALVE PROLAPSE, HX OF 01/26/2007   Qualifier: Diagnosis of  By: Linda Hedges MD, Heinz Knuckles   . Neuropathy, peripheral  ([place])   . Osteopenia 01/26/2007   Chronic   BDS at [facility] Vit D, exercises Pt declined Boniva   . Osteoporosis 01/26/2007   Chronic   BDS at [facility] Vit D, exercises Pt declined Boniva   . Overactive bladder 04/01/2014   Worse 2015   . Sarcoidosis ([place]) 01/26/2007   Annotation: in remission since 1992 Qualifier: Diagnosis of  By: Linda Hedges MD, Heinz Knuckles     Past Surgical History:  Procedure Laterality Date  . BREAST LUMPECTOMY Left    no bp/stick left   . COLONOSCOPY  01-19-2006  . left ankle fracture surgery    . POLYPECTOMY  01-19-2006  . TUBAL LIGATION    . WRIST FRACTURE SURGERY       Current Outpatient Prescriptions:  .  aspirin EC 81 MG tablet, Take 81 mg by mouth daily., Disp: , Rfl:  .  atorvastatin (LIPITOR) 10 MG tablet, Take 1 tablet (10 mg total) by mouth daily., Disp: 30 tablet, Rfl: 3 .  Biotin 5 MG CAPS, Take 1 each by mouth daily., Disp: , Rfl:  .  Calcium-Vitamin D 600-125 MG-UNIT TABS, Take by mouth.  , Disp: , Rfl:  .  Glucosamine-Chondroitin-MSM 500-400-125 MG TABS, Take by mouth daily. 2 tablet daily , Disp: , Rfl:  .  nortriptyline (PAMELOR) 10 MG capsule, Take 10 mg by mouth at bedtime., Disp: , Rfl:  .  risedronate (ACTONEL) 150 MG tablet, Take 150 mg by mouth every  30 (thirty) days. with water on empty stomach, nothing by mouth or lie down for next 30 minutes., Disp: , Rfl:  .  BONIVA 150 MG tablet, Take 1 tablet (150 mg total) by mouth every 30 (thirty) days. Take in the morning with a full glass of water, on an empty stomach, and do not take anything else by mouth or lie down for the next 30 min. (Patient not taking: Reported on 01/27/2016), Disp: 3 tablet, Rfl: 3 .  triamterene-hydrochlorothiazide (MAXZIDE-25) 37.5-25 MG per tablet, 1/2 tab daily (Patient not taking: Reported on 01/27/2016), Disp: 30 tablet, Rfl: 6 Allergies  Allergen Reactions  . Ciprofloxacin Other (See Comments)    Adverse reaction   . Gabapentin Other (See Comments)    Adverse reaction: causing  severe sleepiness    Social History  Substance Use Topics  . Smoking status: Never Smoker  . Smokeless tobacco: Never Used  . Alcohol use No    Family History  Problem Relation Age of Onset  . Colon cancer Neg Hx       Review of Systems  Constitutional: negative for fatigue and weight loss Respiratory: negative for cough and wheezing Cardiovascular: negative for chest pain, fatigue and palpitations Gastrointestinal: negative for abdominal pain and change in bowel habits Musculoskeletal:negative for myalgias Neurological: negative for gait problems and tremors Behavioral/Psych: negative for abusive relationship, depression Endocrine: negative for temperature intolerance   Genitourinary:negative for abnormal menstrual periods, genital lesions, hot flashes, sexual problems and vaginal discharge.  H/O OAB, improved. Integument/breast: negative for breast lump, breast tenderness, nipple discharge and skin lesion(s)    Objective:       BP 124/72   Pulse 72   Temp 98.5 F (36.9 C)   Wt 156 lb 6.4 oz (70.9 kg)   BMI 24.50 kg/m  General:   alert  Skin:   no rash or abnormalities  Lungs:   clear to auscultation bilaterally  Heart:   regular rate and rhythm, S1, S2 normal, no murmur, click, rub or gallop  Breasts:   normal without suspicious masses, skin or nipple changes or axillary nodes  Abdomen:  normal findings: no organomegaly, soft, non-tender and no hernia  Pelvis:  External genitalia: normal general appearance Urinary system: urethral meatus normal and bladder without fullness, nontender Vaginal: normal without tenderness, induration or masses Cervix: normal appearance Adnexa: normal bimanual exam Uterus: anteverted and non-tender, normal size   Lab Review Urine pregnancy test Labs reviewed yes Radiologic studies reviewed yes  50% of 20 min visit spent on counseling and coordination of care.   Assessment:    Healthy female exam.    Postmenopause.  Doing  well.  H/O OAB.  Much improved after treatment by Dr. Zigmund Daniel, Genevive Bi.   Plan:    Education reviewed: calcium supplements, self breast exams and weight bearing exercise. Follow up in: 1 year.   No orders of the defined types were placed in this encounter.  No orders of the defined types were placed in this encounter.

## 2016-01-27 NOTE — Addendum Note (Signed)
Addended by: Lewie Loron D on: 01/27/2016 04:43 PM   Modules accepted: Orders

## 2016-01-30 LAB — PAP IG (IMAGE GUIDED): PAP SMEAR COMMENT: 0

## 2016-09-21 ENCOUNTER — Emergency Department (HOSPITAL_BASED_OUTPATIENT_CLINIC_OR_DEPARTMENT_OTHER)
Admission: EM | Admit: 2016-09-21 | Discharge: 2016-09-21 | Disposition: A | Discharge status: Home / Self Care | Payer: Medicare HMO | Attending: Emergency Medicine | Admitting: Emergency Medicine

## 2016-09-21 ENCOUNTER — Encounter (HOSPITAL_BASED_OUTPATIENT_CLINIC_OR_DEPARTMENT_OTHER): Payer: Self-pay | Admitting: Emergency Medicine

## 2016-09-21 ENCOUNTER — Emergency Department (HOSPITAL_BASED_OUTPATIENT_CLINIC_OR_DEPARTMENT_OTHER): Payer: Medicare HMO

## 2016-09-21 DIAGNOSIS — Z7982 Long term (current) use of aspirin: Secondary | ICD-10-CM | POA: Insufficient documentation

## 2016-09-21 DIAGNOSIS — I10 Essential (primary) hypertension: Secondary | ICD-10-CM | POA: Diagnosis not present

## 2016-09-21 DIAGNOSIS — R519 Headache, unspecified: Secondary | ICD-10-CM

## 2016-09-21 DIAGNOSIS — Z853 Personal history of malignant neoplasm of breast: Secondary | ICD-10-CM | POA: Diagnosis not present

## 2016-09-21 DIAGNOSIS — Z79899 Other long term (current) drug therapy: Secondary | ICD-10-CM | POA: Diagnosis not present

## 2016-09-21 DIAGNOSIS — R51 Headache: Secondary | ICD-10-CM | POA: Diagnosis not present

## 2016-09-21 DIAGNOSIS — E119 Type 2 diabetes mellitus without complications: Secondary | ICD-10-CM | POA: Insufficient documentation

## 2016-09-21 MED ORDER — DEXAMETHASONE SODIUM PHOSPHATE 10 MG/ML IJ SOLN
10.0000 mg | Freq: Once | INTRAMUSCULAR | Status: AC
  Administered 2016-09-21: 10 mg via INTRAVENOUS
  Filled 2016-09-21: qty 1

## 2016-09-21 MED ORDER — SODIUM CHLORIDE 0.9 % IV BOLUS (SEPSIS)
1000.0000 mL | Freq: Once | INTRAVENOUS | Status: AC
  Administered 2016-09-21: 1000 mL via INTRAVENOUS

## 2016-09-21 MED ORDER — DIPHENHYDRAMINE HCL 50 MG/ML IJ SOLN
25.0000 mg | Freq: Once | INTRAMUSCULAR | Status: AC
  Administered 2016-09-21: 25 mg via INTRAVENOUS
  Filled 2016-09-21: qty 1

## 2016-09-21 MED ORDER — PROCHLORPERAZINE EDISYLATE 5 MG/ML IJ SOLN
10.0000 mg | Freq: Once | INTRAMUSCULAR | Status: AC
  Administered 2016-09-21: 10 mg via INTRAVENOUS
  Filled 2016-09-21: qty 2

## 2016-09-21 NOTE — ED Notes (Signed)
Patient transported to CT 

## 2016-09-21 NOTE — Discharge Instructions (Signed)
We are glad that you're headache felt better after medicines today. We suspect that is what caused the episode of numbness that he felt. Please call and schedule an appointment with your neurologist for further management. If any symptoms change or worsen or he develop any new neurologic deficits, please return to the nearest emergency department.

## 2016-09-21 NOTE — ED Notes (Signed)
ED Provider at bedside. 

## 2016-09-21 NOTE — ED Triage Notes (Signed)
Pt has left sided headache for over two weeks.  Pt denies any weakness.  Some numbness of upper lip last night but none today.  Fast negative.  Pt states she did have n/v last week but none now.  No photosensitivity.  Went to PCP yesterday for same and BP was a concern.

## 2016-09-21 NOTE — ED Provider Notes (Signed)
[place] DEPT [facility] Provider Note   CSN: [id number] Arrival date & time: 09/21/16  0732     History   Chief Complaint Chief Complaint  Patient presents with  . Headache    HPI [name] [name] is a 71 y.o. female.  The history is provided by the patient.  Headache   This is a new problem. The current episode started more than 1 week ago. The problem occurs constantly. The problem has not changed since onset.The headache is associated with nothing. The pain is located in the left unilateral region. The quality of the pain is described as dull. The pain is at a severity of 4/10. The pain is moderate. The pain does not radiate. Pertinent negatives include no fever, no chest pressure, no near-syncope, no orthopnea, no syncope, no shortness of breath, no nausea and no vomiting. She has tried nothing for the symptoms. The treatment provided no relief.    Past Medical History:  Diagnosis Date  . Breast cancer ([place]) 09/14/2011   T1N0 left March 2001, treated with lumpectomy and 11 node axillary evaluation, ER/PR negative, HER2 3+. Adjuvant adriamycin/cytoxan X 4 cycles.   . 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 Dyslipidemia 10/18/2009   Mild     . Essential hypertension 05/26/2008   Chronic     . Headache 07/23/2014  . HYPERGLYCEMIA 10/18/2009   Qualifier: Diagnosis of  By: Linda Hedges MD, [place], CHRONIC 08/15/2008   Qualifier: Diagnosis of  By: Linda Hedges MD, Heinz Knuckles   . MITRAL VALVE PROLAPSE, HX OF 01/26/2007   Qualifier: Diagnosis of  By: Linda Hedges MD, Heinz Knuckles   . Neuropathy, peripheral   . Osteopenia 01/26/2007   Chronic   BDS at [facility] Vit D, exercises Pt declined [place]   . Osteoporosis 01/26/2007   Chronic   BDS at [facility] Vit D, exercises Pt declined [place]   . Overactive bladder 04/01/2014   Worse 2015   . Sarcoidosis 01/26/2007   Annotation: in remission since 1992 Qualifier: Diagnosis of  By: Linda Hedges MD, Heinz Knuckles     Patient Active Problem List   Diagnosis Date Noted  . 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 Hyperlipemia 03/13/2015  . Diplopia 02/25/2015  . Headache 07/23/2014  . Peripheral edema 04/22/2014  . Chronic hypokalemia 04/01/2014  . Overactive bladder 04/01/2014  . Breast cancer ([place]) 09/14/2011  . Dyslipidemia 10/18/2009  . 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 INSOMNIA, CHRONIC 08/15/2008  . Essential hypertension 05/26/2008  . Sarcoidosis 01/26/2007  . Osteopenia 01/26/2007  . MITRAL VALVE PROLAPSE, HX OF 01/26/2007  . COLONIC POLYPS, HX OF 12/09/2006    Past Surgical History:  Procedure Laterality Date  . BREAST LUMPECTOMY Left    no bp/stick left   . COLONOSCOPY  01-19-2006  . left ankle fracture surgery    . POLYPECTOMY  01-19-2006  . TUBAL LIGATION    . WRIST FRACTURE SURGERY      OB History    No data available       Home Medications    Prior to Admission medications   Medication Sig Start Date End Date Taking? Authorizing Provider  amLODipine (NORVASC) 5 MG tablet Take 5 mg by mouth daily.   Yes Historical Provider, MD  aspirin EC 81 MG tablet Take 81 mg by mouth daily.   Yes Historical Provider, MD  atorvastatin (LIPITOR) 10 MG tablet Take 1 tablet (10 mg total)  by mouth daily. 12/10/14  Yes Mosie Lukes, MD  Biotin 5 MG CAPS Take 1 each by mouth daily.   Yes Historical Provider, MD  Calcium-Vitamin D 600-125 MG-UNIT TABS Take by mouth.     Yes Historical Provider, MD  Glucosamine-Chondroitin-MSM [phone] MG TABS Take by mouth daily. 2 tablet daily    Yes Historical Provider, MD  nortriptyline (PAMELOR) 10 MG capsule Take 40 mg by mouth at bedtime.    Yes Historical Provider, MD  risedronate (ACTONEL) 150 MG tablet Take 150 mg by mouth every 30 (thirty) days. with water on empty stomach, nothing by mouth or lie down for next 30 minutes.   Yes Historical Provider, MD  valsartan (DIOVAN) 320 MG tablet Take 320 mg by mouth daily. 09/14/16  Yes Historical  Provider, MD    Family History Family History  Problem Relation Age of Onset  . Colon cancer Neg Hx     Social History Social History  Substance Use Topics  . Smoking status: Never Smoker  . Smokeless tobacco: Never Used  . Alcohol use No     Allergies   Ciprofloxacin and Gabapentin   Review of Systems Review of Systems  Constitutional: Negative for chills, diaphoresis, fatigue and fever.  HENT: Negative for congestion and rhinorrhea.   Eyes: Negative for visual disturbance.  Respiratory: Negative for cough, chest tightness, shortness of breath, wheezing and stridor.   Cardiovascular: Negative for chest pain, orthopnea, syncope and near-syncope.  Gastrointestinal: Negative for abdominal pain, constipation, diarrhea, nausea and vomiting.  Genitourinary: Negative for dysuria, flank pain and frequency.  Musculoskeletal: Negative for back pain, neck pain and neck stiffness.  Skin: Negative for rash and wound.  Neurological: Positive for speech difficulty (resolved) and headaches. Negative for seizures, syncope, weakness, light-headedness and numbness.  Psychiatric/Behavioral: Negative for agitation, behavioral problems and confusion.  All other systems reviewed and are negative.    Physical Exam Updated Vital Signs BP (!) 150/90 (BP Location: Left Arm)   Pulse 71   Temp 98.4 F (36.9 C) (Oral)   Resp 18   Ht _0  (1.702 m)   Wt 161 lb (73 kg)   SpO2 99%   BMI 25.22 kg/m   Physical Exam  Constitutional: She is oriented to person, place, and time. She appears well-developed and well-nourished. No distress.  HENT:  Head: Normocephalic and atraumatic.  Right Ear: External ear normal.  Left Ear: External ear normal.  Nose: Nose normal.  Mouth/Throat: Oropharynx is clear and moist. No oropharyngeal exudate.  Eyes: Conjunctivae and EOM are normal. Pupils are equal, round, and reactive to light.  Neck: Normal range of motion. Neck supple.  Cardiovascular: Normal  rate, regular rhythm, normal heart sounds and intact distal pulses.   No murmur heard. Pulmonary/Chest: Effort normal and breath sounds normal. No stridor. No respiratory distress. She has no wheezes. She exhibits no tenderness.  Abdominal: Soft. Bowel sounds are normal. She exhibits no distension. There is no tenderness. There is no rebound.  Musculoskeletal: She exhibits no tenderness.  Neurological: She is alert and oriented to person, place, and time. She has normal strength and normal reflexes. She is not disoriented. She displays no tremor. No cranial nerve deficit or sensory deficit. She exhibits normal muscle tone. Coordination and gait normal. GCS eye subscore is 4. GCS verbal subscore is 5. GCS motor subscore is 6.  No focal neurologic deficits.   Skin: Skin is warm. Capillary refill takes less than 2 seconds. No rash noted. She is not  diaphoretic. No erythema.  Psychiatric: She has a normal mood and affect.  Nursing note and vitals reviewed.    ED Treatments / Results  Labs (all labs ordered are listed, but only abnormal results are displayed) Labs Reviewed - No data to display  EKG  EKG Interpretation None       Radiology Ct Head Wo Contrast  Result Date: 09/21/2016 CLINICAL DATA:  Headaches. EXAM: CT HEAD WITHOUT CONTRAST TECHNIQUE: Contiguous axial images were obtained from the base of the skull through the vertex without intravenous contrast. COMPARISON:  CT scan of February 26, 2015. FINDINGS: Brain: No evidence of acute infarction, hemorrhage, hydrocephalus, extra-axial collection or mass lesion/mass effect. Vascular: No hyperdense vessel or unexpected calcification. Skull: Normal. Negative for fracture or focal lesion. Sinuses/Orbits: No acute finding. Other: None. IMPRESSION: Normal head CT. Electronically Signed   By: Marijo Conception, M.D.   On: 09/21/2016 09:06    Procedures Procedures (including critical care time)  Medications Ordered in ED Medications  sodium  chloride 0.9 % bolus 1,000 mL (0 mLs Intravenous Stopped 09/21/16 1041)  dexamethasone (DECADRON) injection 10 mg (10 mg Intravenous Given 09/21/16 0931)  prochlorperazine (COMPAZINE) injection 10 mg (10 mg Intravenous Given 09/21/16 0932)  diphenhydrAMINE (BENADRYL) injection 25 mg (25 mg Intravenous Given 09/21/16 0929)     Initial Impression / Assessment and Plan / ED Course  I have reviewed the triage vital signs and the nursing notes.  Pertinent labs & imaging results that were available during my care of the patient were reviewed by me and considered in my medical decision making (see chart for details).     [name] [name] is a 71 y.o. female with a past medical history significant for hypertension with recent medication change, breast cancer, GERD, and sarcoidosis who presents with 2 weeks of left-sided headache, and an episode of transient left lip numbness. Patient reports that for the last 2 weeks, she has had intermittent left-sided headache. She describes that it is worsened with lying flat. She says that she had nausea and vomiting on the first day of the symptoms but says that has improved. She says that it gets up to a 9 out of 10 at times but is currently 4 out of 10. She says that she chronically has some blurry vision which is unchanged. She says that she saw her PCP yesterday and was switched from valsartan to amlodipine for blood pressure control. Patient blood pressure on arrival is 814 systolic. Patient said that when going to bed last night, she noticed that her lip was numb "as though I was at the dentist and they numbed it". She denied any visible swelling or changes in her smile. She had no difficulty speaking. She had no other neurologic deficits. Patient said it lasted for 1 hour and resolved. She has not had any other symptoms today.  Patient denies any recent traumas or similar symptoms. She denies fevers, chills, urinary symptoms, conservation, diarrhea, chest pain, palpitations  or other symptoms.  History and exam are seen above. On exam. Patient had no focal neurologic deficits. Normal coordination, grip strength, sensation and all history is, and sensation on face. No speech difficulties and symmetric smile. No nuchal rigidity or change in symptoms with neck movement. No abnormality on exam.  Patient will have CT head to look for a normality given the 2 weeks of left-sided headache and left-sided transient numbness. If this is negative, patient will be given a cocktail of headache medications to try and  resolve her symptoms. Suspect a complicated migraine versus a hypertensive emergency causing her symptoms however, patient will be reassessed to see for improvement.  CT scan showed no significant abnormality. She reassessed after headache medications and headache was almost completely resolved.  Neurology was called and they did not recommend MRI but felt she should follow-up with a neurologist for further management. Patient reports that she will go see her neurologist.   Suspect atypical migraine over TIA as cause of symptoms. Patient continued to feel well and felt stable for discharge. Patient understood return precautions for any new or worsened symptoms as well as for follow-up instructions. Patient had no other questions or concerns and patient was discharged in good condition with significant improvement in her presenting symptoms.   Final Clinical Impressions(s) / ED Diagnoses   Final diagnoses:  Bad headache    New Prescriptions Discharge Medication List as of 09/21/2016 11:22 AM      Clinical Impression: 1. Bad headache     Disposition: Discharge  Condition: Good  I have discussed the results, Dx and Tx plan with the pt(& family if present). He/she/they expressed understanding and agree(s) with the plan. Discharge instructions discussed at great length. Strict return precautions discussed and pt &/or family have verbalized understanding of the  instructions. No further questions at time of discharge.    Discharge Medication List as of 09/21/2016 11:22 AM      Follow Up: Valaria Good. [address], [address] [place] [address] [phone]  Schedule an appointment as soon as possible for a visit    [facility] [address] [id number] mc [address] [place] [phone]  If symptoms worsen     Courtney Paris, MD 09/21/16 [phone]

## 2016-11-09 ENCOUNTER — Encounter: Payer: Self-pay | Admitting: *Deleted

## 2016-11-29 IMAGING — CT CT HEAD WO/W CM
1 of 2 series · 14 of 30 positions shown, 18 images · IV contrast (APPLIED)
Comparison: None.

CLINICAL DATA: Diplopia.  Left sided 6 nerve palsy

EXAM:
CT HEAD WITHOUT AND WITH CONTRAST
TECHNIQUE: Contiguous axial images were obtained from the base of the skull
through the vertex without and with intravenous contrast
CONTRAST:  75mL OMNIPAQUE IOHEXOL 300 MG/ML  SOLN

[Series 3: head w/ cm 4.8 h37s · axial · 0.44mm/px · z∈[-30,+112]mm · 14 of 36 slices shown, 18 images]
[im 3/36  brain]
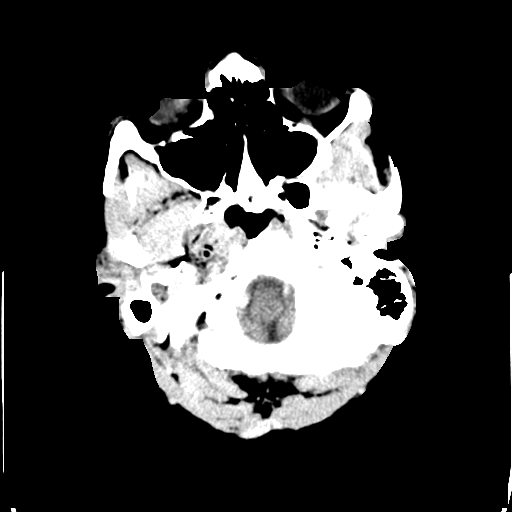
[im 3/36  bone]
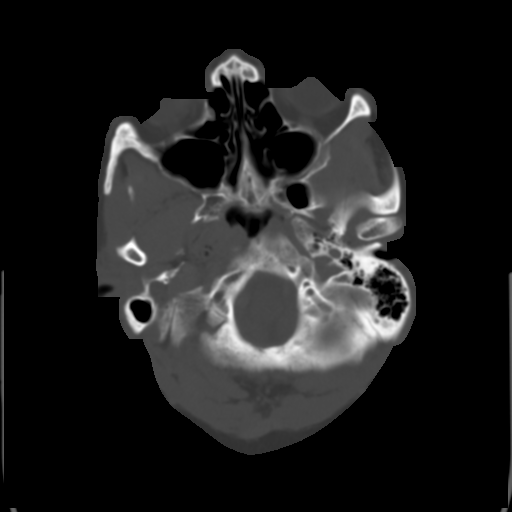
[im 5/36  brain]
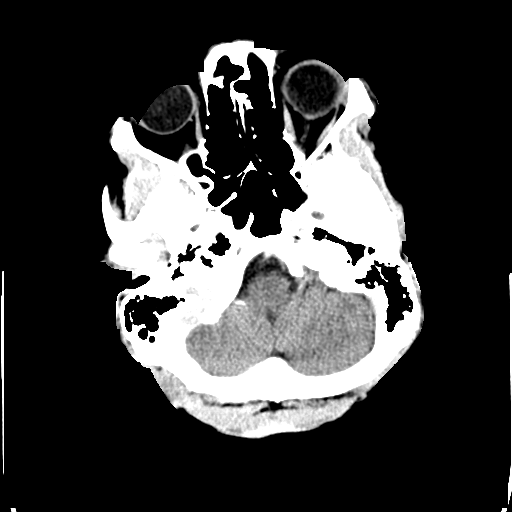
[im 8/36  brain]
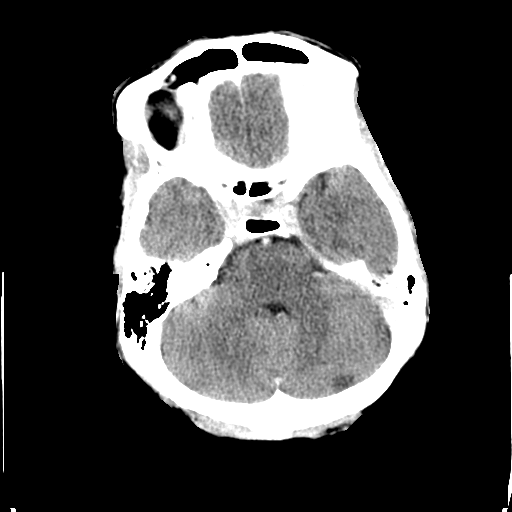
[im 10/36  brain]
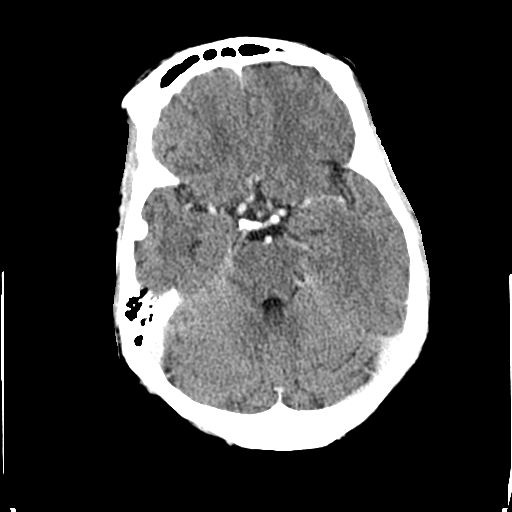
[im 12/36  brain]
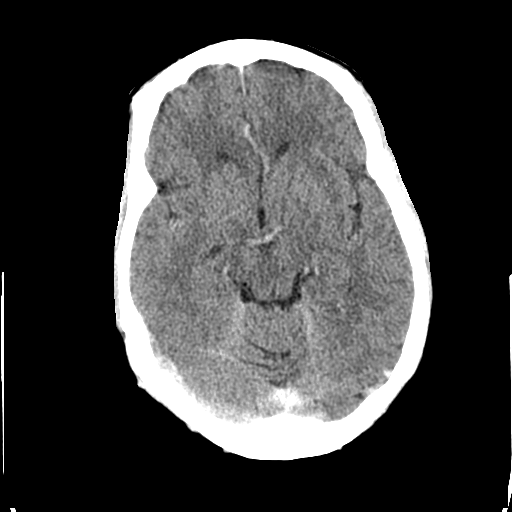
[im 12/36  bone]
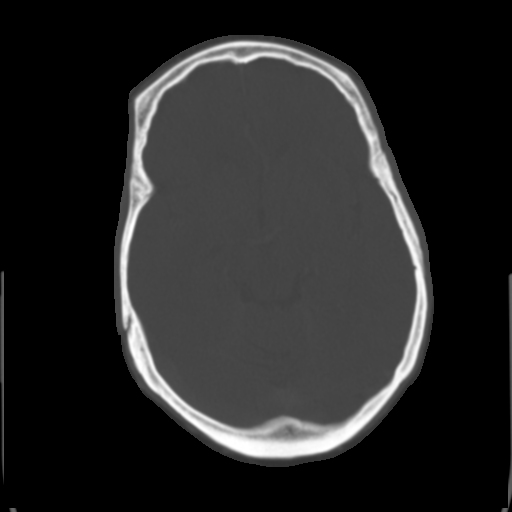
[im 15/36  brain]
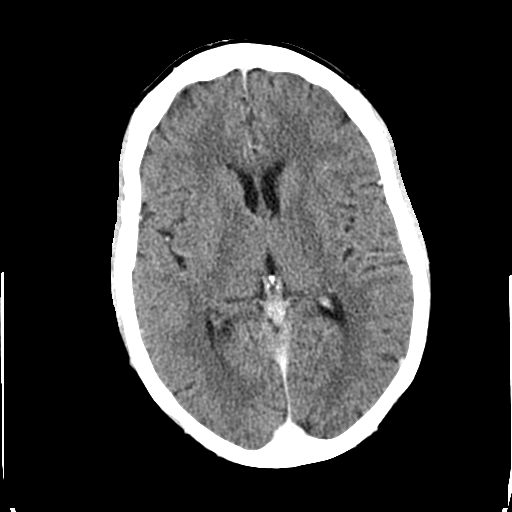
[im 17/36  brain]
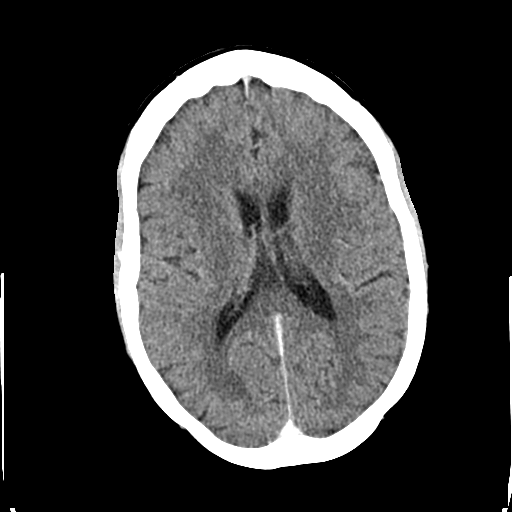
[im 19/36  brain]
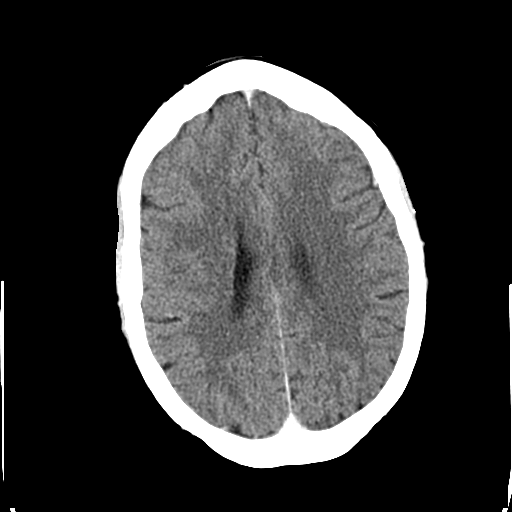
[im 22/36  brain]
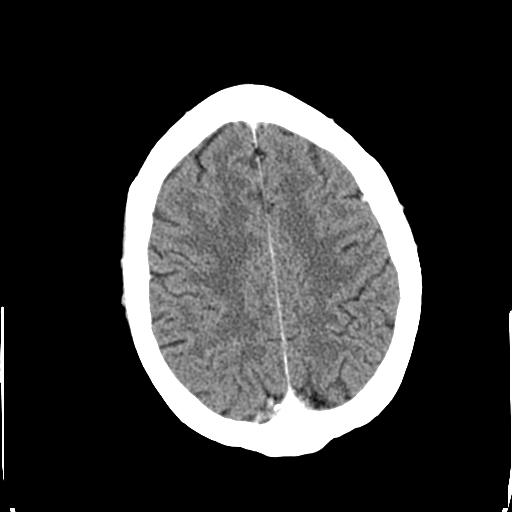
[im 22/36  bone]
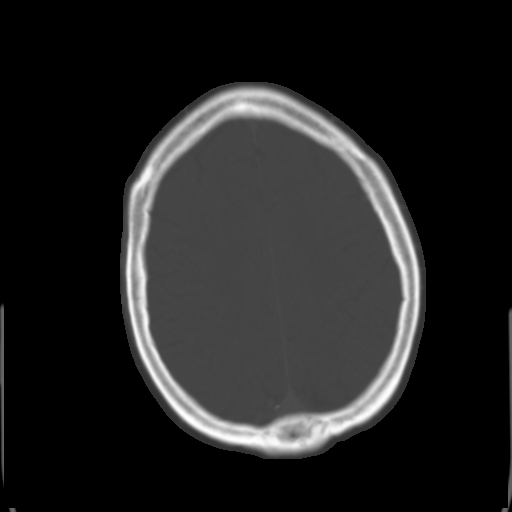
[im 24/36  brain]
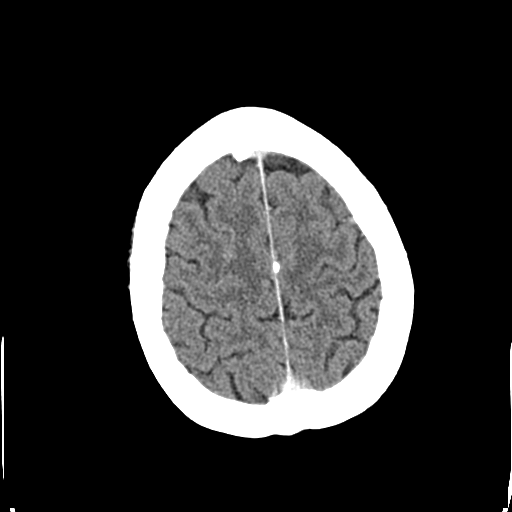
[im 26/36  brain]
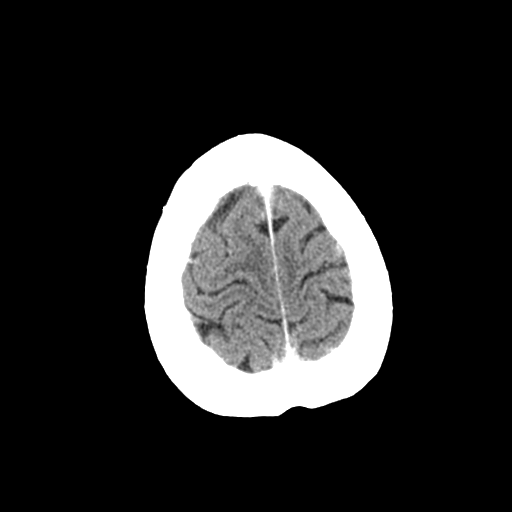
[im 29/36  brain]
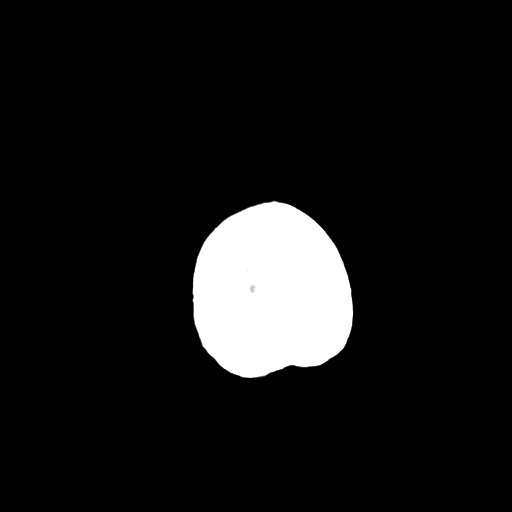
[im 31/36  brain]
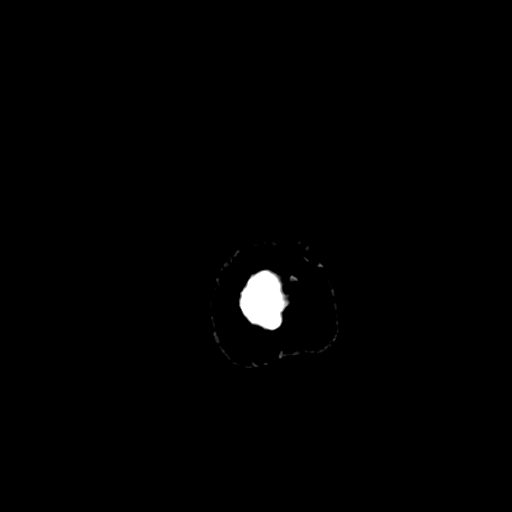
[im 31/36  bone]
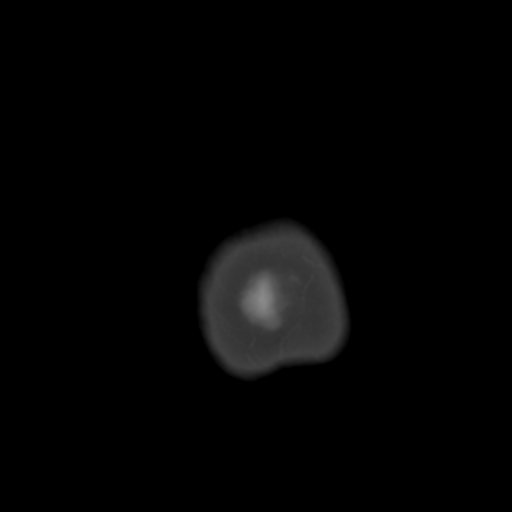
[im 33/36  brain]
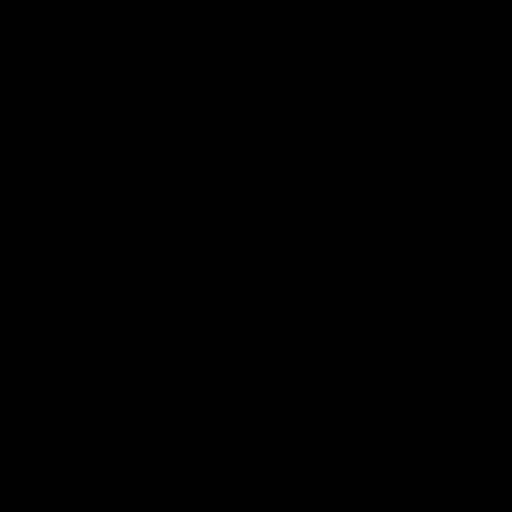

[14 of 30 positions shown; findings below may reference images not displayed]

FINDINGS: Cerebral volume normal.  Ventricle size normal.

Negative for acute or chronic infarction. No white matter disease
identified. Negative for hemorrhage.

8 mm dural calcification right middle cranial fossa likely a small
meningioma without enhancing mass lesion or brain edema.

Normal enhancement following contrast infusion.

Bilateral lens replacement.  Calvarium intact.
IMPRESSION: No cause for blurred vision identified. Followup MRI may be helpful
if indicated

8 mm dural calcification right middle cranial fossa consistent with
meningioma.
